# Patient Record
Sex: Female | Born: 1975 | Race: White | Hispanic: No | Marital: Married | Smoking: Never smoker
Health system: Southern US, Community
[De-identification: ages and names within clinical notes are randomized; demographics above are authoritative.]

## PROBLEM LIST (undated history)

## (undated) ENCOUNTER — Inpatient Hospital Stay (HOSPITAL_COMMUNITY): Payer: Self-pay

## (undated) DIAGNOSIS — Z8672 Personal history of thrombophlebitis: Secondary | ICD-10-CM

## (undated) DIAGNOSIS — O24419 Gestational diabetes mellitus in pregnancy, unspecified control: Secondary | ICD-10-CM

## (undated) HISTORY — DX: Personal history of thrombophlebitis: Z86.72

## (undated) HISTORY — DX: Gestational diabetes mellitus in pregnancy, unspecified control: O24.419

## (undated) HISTORY — PX: NO PAST SURGERIES: SHX2092

---

## 2011-09-05 ENCOUNTER — Encounter (HOSPITAL_COMMUNITY): Payer: Self-pay | Admitting: *Deleted

## 2011-09-05 ENCOUNTER — Inpatient Hospital Stay (HOSPITAL_COMMUNITY)
Admission: EM | Admit: 2011-09-05 | Discharge: 2011-09-08 | DRG: 078 | Disposition: A | Discharge status: Home / Self Care | Payer: BC Managed Care – PPO | Attending: Internal Medicine | Admitting: Internal Medicine

## 2011-09-05 ENCOUNTER — Other Ambulatory Visit: Payer: Self-pay

## 2011-09-05 ENCOUNTER — Emergency Department (HOSPITAL_COMMUNITY): Payer: BC Managed Care – PPO

## 2011-09-05 DIAGNOSIS — Z79899 Other long term (current) drug therapy: Secondary | ICD-10-CM

## 2011-09-05 DIAGNOSIS — I2699 Other pulmonary embolism without acute cor pulmonale: Principal | ICD-10-CM | POA: Diagnosis present

## 2011-09-05 DIAGNOSIS — E663 Overweight: Secondary | ICD-10-CM | POA: Diagnosis present

## 2011-09-05 DIAGNOSIS — Z3041 Encounter for surveillance of contraceptive pills: Secondary | ICD-10-CM

## 2011-09-05 LAB — COMPREHENSIVE METABOLIC PANEL
ALT: 13 U/L (ref 0–35)
AST: 11 U/L (ref 0–37)
Alkaline Phosphatase: 75 U/L (ref 39–117)
CO2: 19 mEq/L (ref 19–32)
Calcium: 9.9 mg/dL (ref 8.4–10.5)
GFR calc Af Amer: >90 mL/min (ref >90)
Glucose, Bld: 115 mg/dL — ABNORMAL HIGH (ref 70–99)
Potassium: 4 mEq/L (ref 3.5–5.1)
Sodium: 135 mEq/L (ref 135–145)
Total Protein: 7.5 g/dL (ref 6.0–8.3)

## 2011-09-05 LAB — CBC
Platelets: 252 10*3/uL (ref 150–400)
RBC: 4.65 MIL/uL (ref 3.87–5.11)
RDW: 12.4 % (ref 11.5–15.5)
WBC: 13.7 10*3/uL — ABNORMAL HIGH (ref 4.0–10.5)

## 2011-09-05 LAB — POCT PREGNANCY, URINE: Preg Test, Ur: NEGATIVE

## 2011-09-05 LAB — URINALYSIS, ROUTINE W REFLEX MICROSCOPIC
Leukocytes, UA: NEGATIVE
Nitrite: NEGATIVE
Protein, ur: NEGATIVE mg/dL
Specific Gravity, Urine: 1.017 (ref 1.005–1.030)
Urobilinogen, UA: 0.2 mg/dL (ref 0.0–1.0)

## 2011-09-05 LAB — POCT I-STAT TROPONIN I

## 2011-09-05 LAB — DIFFERENTIAL
Basophils Absolute: 0 10*3/uL (ref 0.0–0.1)
Eosinophils Relative: 2 % (ref 0–5)
Lymphocytes Relative: 15 % (ref 12–46)
Lymphs Abs: 2.1 10*3/uL (ref 0.7–4.0)
Neutrophils Relative %: 76 % (ref 43–77)

## 2011-09-05 LAB — PRO B NATRIURETIC PEPTIDE: Pro B Natriuretic peptide (BNP): 22 pg/mL (ref 0–125)

## 2011-09-05 LAB — URINE MICROSCOPIC-ADD ON

## 2011-09-05 LAB — D-DIMER, QUANTITATIVE: D-Dimer, Quant: 2.28 ug/mL-FEU — ABNORMAL HIGH (ref 0.00–0.48)

## 2011-09-05 MED ORDER — NITROGLYCERIN 0.4 MG SL SUBL: 0.4000 mg | SUBLINGUAL_TABLET | SUBLINGUAL | Status: DC | PRN

## 2011-09-05 MED ORDER — ASPIRIN 325 MG PO TABS: 325.0000 mg | ORAL_TABLET | ORAL | Status: DC

## 2011-09-05 NOTE — ED Notes (Signed)
Patient transported to X-ray 

## 2011-09-05 NOTE — ED Notes (Signed)
The pt is c/o some posterior chest pain whenever she breathes otherwise she feels ok

## 2011-09-05 NOTE — ED Notes (Signed)
Pt was having back pain yesterday and CP under her left breast which began this am and became worse this pm.  Pt has sob with this.  No n/v or diaphoresis

## 2011-09-05 NOTE — ED Provider Notes (Signed)
History     CSN: [id number]  Arrival date & time 09/05/11  2127   First MD Initiated Contact with Patient 09/05/11 2213      Chief Complaint  Patient presents with  . Chest Pain    (Consider location/radiation/quality/duration/timing/severity/associated sxs/prior treatment) Patient is a 36 y.o. female presenting with chest pain. The history is provided by the patient. No language interpreter was used.  Chest Pain The chest pain began yesterday. Chest pain occurs constantly. The chest pain is worsening. The pain is associated with breathing. At its most intense, the pain is at 10/10. The pain is currently at 2/10. The severity of the pain is moderate. The quality of the pain is described as aching, sharp and stabbing.    patient here today complaining of left side pain is as bad as 10 out of 10 with shortness of breath. States that she woke up yesterday morning with upper back pain that subsided after the day went on. States she woke up this morning with the left side pain that did radiate at one point but now it is located under the left ribs. The pain is not reproducible but it does cause shortness of breath intermittently. States she has no history of a blood clot and she has no history of long trips she has no calf pain. She  has been on long trips, she is on the nova ring.  No family history of blood clots just  hypertension.  History reviewed. No pertinent past medical history.  History reviewed. No pertinent past surgical history.  No family history on file.  History  Substance Use Topics  . Smoking status: Not on file  . Smokeless tobacco: Not on file  . Alcohol Use: No    OB History    Grav Para Term Preterm Abortions TAB SAB Ect Mult Living                  Review of Systems  Cardiovascular: Positive for chest pain.    Allergies  Review of patient's allergies indicates no known allergies.  Home Medications   Current Outpatient Rx  Name Route Sig Dispense  Refill  . ETONOGESTREL-ETHINYL ESTRADIOL 0.12-0.015 MG/24HR VA RING Vaginal Place 1 each vaginally every 28 (twenty-eight) days. Insert vaginally and leave in place for 3 consecutive weeks, then remove for 1 week.    . ALEVE-D SINUS & COLD PO Oral Take 1 tablet by mouth daily as needed. For cold and sinus      BP 130/90  Pulse 111  Temp(Src) 99.4 F (37.4 C) (Oral)  Resp 20  SpO2 97%  LMP 08/22/2011  Physical Exam  Nursing note and vitals reviewed. Constitutional: She is oriented to person, place, and time. She appears well-developed and well-nourished.  HENT:  Head: Normocephalic and atraumatic.  Eyes: Conjunctivae and EOM are normal. Pupils are equal, round, and reactive to light.  Neck: Normal range of motion. Neck supple.  Cardiovascular: Normal rate, regular rhythm, normal heart sounds and intact distal pulses.  Exam reveals no gallop and no friction rub.   No murmur heard. Pulmonary/Chest: Effort normal and breath sounds normal. No respiratory distress. She has no wheezes. She has no rales. She exhibits no tenderness.       Pain located under L ribs laterally  Abdominal: Soft. Bowel sounds are normal. She exhibits no distension. There is no tenderness.  Musculoskeletal: Normal range of motion. She exhibits no edema and no tenderness.  Neurological: She is alert and oriented to  person, place, and time. She has normal reflexes. No cranial nerve deficit.  Skin: Skin is warm and dry.  Psychiatric: She has a normal mood and affect.    ED Course  Procedures (including critical care time)   Labs Reviewed  CBC  BASIC METABOLIC PANEL  PRO B NATRIURETIC PEPTIDE  TROPONIN I   Dg Chest 2 View  09/05/2011  *RADIOLOGY REPORT*  Clinical Data: 36 year old female with chest pain, cough, pleuritic pain.  CHEST - 2 VIEW  Comparison: None.  Findings: Low lung volumes with bibasilar atelectasis.  No definite pleural effusion.  Cardiac size and mediastinal contours are within normal limits.   Visualized tracheal air column is within normal limits.  No pneumothorax, pulmonary edema or consolidation. Negative visualized bowel gas pattern.  Exaggerated lower thoracic kyphosis.  IMPRESSION: Low lung volumes with bibasilar atelectasis.  Original Report Authenticated By: Harley Hallmark, M.D.     No diagnosis found.    MDM   36 year old coming in with pain under her left lateral rib cage. She is tachycardic and has shortness of breath intermittently with the pain. She woke yesterday with upper back pain that subsided throughout the day. Woke this morning with left lateral pain under her rib cage. States that the pain did make her short of breath intermittently.   CT of the chest  shows numerous bilateral pulmonary emboli bilateral lower lobes. Heparin bolus and drip started. Patient will be admitted via hospitalist. Grandmother had a blood clot per Dr. Hyacinth Meeker. D dimer 2.28.  She will be admitted per hospitalist.  Heparing bolus and drip started in the ER.     Labs Reviewed  D-DIMER, QUANTITATIVE - Abnormal; Notable for the following:    D-Dimer, Quant 2.28 (*)    All other components within normal limits  URINALYSIS, ROUTINE W REFLEX MICROSCOPIC - Abnormal; Notable for the following:    Hgb urine dipstick TRACE (*)    All other components within normal limits  CBC - Abnormal; Notable for the following:    WBC 13.7 (*)    All other components within normal limits  DIFFERENTIAL - Abnormal; Notable for the following:    Neutro Abs 10.3 (*)    All other components within normal limits  COMPREHENSIVE METABOLIC PANEL - Abnormal; Notable for the following:    Glucose, Bld 115 (*)    Albumin 3.4 (*)    Total Bilirubin 0.2 (*)    All other components within normal limits  PRO B NATRIURETIC PEPTIDE  POCT PREGNANCY, URINE  POCT I-STAT TROPONIN I  URINE MICROSCOPIC-ADD ON  PROTIME-INR  APTT         Jethro Bastos, NP 09/06/11 1246

## 2011-09-06 ENCOUNTER — Encounter (HOSPITAL_COMMUNITY): Payer: Self-pay | Admitting: Radiology

## 2011-09-06 ENCOUNTER — Emergency Department (HOSPITAL_COMMUNITY): Payer: BC Managed Care – PPO

## 2011-09-06 DIAGNOSIS — I2699 Other pulmonary embolism without acute cor pulmonale: Secondary | ICD-10-CM | POA: Diagnosis present

## 2011-09-06 DIAGNOSIS — Z3041 Encounter for surveillance of contraceptive pills: Secondary | ICD-10-CM

## 2011-09-06 LAB — BASIC METABOLIC PANEL
GFR calc Af Amer: >90 mL/min (ref >90)
GFR calc non Af Amer: >90 mL/min (ref >90)
Glucose, Bld: 113 mg/dL — ABNORMAL HIGH (ref 70–99)
Potassium: 4.3 mEq/L (ref 3.5–5.1)
Sodium: 140 mEq/L (ref 135–145)

## 2011-09-06 LAB — HOMOCYSTEINE: Homocysteine: 7 umol/L (ref 4.0–15.4)

## 2011-09-06 LAB — CBC
HCT: 40 % (ref 36.0–46.0)
RDW: 12.6 % (ref 11.5–15.5)
WBC: 11.3 10*3/uL — ABNORMAL HIGH (ref 4.0–10.5)

## 2011-09-06 LAB — APTT: aPTT: 27 seconds (ref 24–37)

## 2011-09-06 LAB — PROTIME-INR: Prothrombin Time: 14.6 seconds (ref 11.6–15.2)

## 2011-09-06 LAB — HEPARIN LEVEL (UNFRACTIONATED): Heparin Unfractionated: 0.41 IU/mL (ref 0.30–0.70)

## 2011-09-06 LAB — MRSA PCR SCREENING: MRSA by PCR: NEGATIVE

## 2011-09-06 MED ORDER — PATIENT'S GUIDE TO USING COUMADIN BOOK
Freq: Once | Status: DC
  Filled 2011-09-06 (×2): qty 1

## 2011-09-06 MED ORDER — OXYCODONE HCL 5 MG PO TABS
5.0000 mg | ORAL_TABLET | ORAL | Status: DC | PRN
  Administered 2011-09-06 (×2): 5 mg via ORAL
  Filled 2011-09-06 (×3): qty 1

## 2011-09-06 MED ORDER — RIVAROXABAN 15 MG PO TABS
15.0000 mg | ORAL_TABLET | Freq: Two times a day (BID) | ORAL | Status: DC
  Filled 2011-09-06 (×2): qty 1

## 2011-09-06 MED ORDER — HEPARIN BOLUS VIA INFUSION
5000.0000 [IU] | Freq: Once | INTRAVENOUS | Status: AC
  Administered 2011-09-06: 5000 [IU] via INTRAVENOUS

## 2011-09-06 MED ORDER — HEPARIN (PORCINE) IN NACL 100-0.45 UNIT/ML-% IJ SOLN
1250.0000 [IU]/h | INTRAMUSCULAR | Status: AC
  Administered 2011-09-06: 1250 [IU]/h via INTRAVENOUS
  Filled 2011-09-06: qty 250

## 2011-09-06 MED ORDER — SODIUM CHLORIDE 0.9 % IV SOLN: INTRAVENOUS | Status: DC

## 2011-09-06 MED ORDER — HEPARIN (PORCINE) IN NACL 100-0.45 UNIT/ML-% IJ SOLN
16.0000 [IU]/kg/h | INTRAMUSCULAR | Status: DC
  Administered 2011-09-06 (×2): 16 [IU]/kg/h via INTRAVENOUS
  Filled 2011-09-06 (×3): qty 250

## 2011-09-06 MED ORDER — WARFARIN SODIUM 7.5 MG PO TABS
7.5000 mg | ORAL_TABLET | Freq: Once | ORAL | Status: DC
  Filled 2011-09-06: qty 1

## 2011-09-06 MED ORDER — IOHEXOL 350 MG/ML SOLN
100.0000 mL | Freq: Once | INTRAVENOUS | Status: AC | PRN
  Administered 2011-09-06: 100 mL via INTRAVENOUS

## 2011-09-06 MED ORDER — RIVAROXABAN 10 MG PO TABS: 20.0000 mg | ORAL_TABLET | Freq: Every day | ORAL | Status: DC

## 2011-09-06 MED ORDER — MORPHINE SULFATE 2 MG/ML IJ SOLN
2.0000 mg | INTRAMUSCULAR | Status: DC | PRN
  Administered 2011-09-06 (×2): 2 mg via INTRAVENOUS
  Filled 2011-09-06 (×2): qty 1

## 2011-09-06 MED ORDER — RIVAROXABAN 15 MG PO TABS
15.0000 mg | ORAL_TABLET | Freq: Two times a day (BID) | ORAL | Status: DC
  Administered 2011-09-07 – 2011-09-08 (×3): 15 mg via ORAL
  Filled 2011-09-06 (×5): qty 1

## 2011-09-06 MED ORDER — WARFARIN VIDEO
Freq: Once | Status: DC
Start: 2011-09-06 — End: 2011-09-06

## 2011-09-06 MED ORDER — SODIUM CHLORIDE 0.9 % IJ SOLN
3.0000 mL | Freq: Two times a day (BID) | INTRAMUSCULAR | Status: DC
  Administered 2011-09-07 – 2011-09-08 (×3): 3 mL via INTRAVENOUS

## 2011-09-06 NOTE — ED Notes (Signed)
Report to the floor has been called.  Waiting for orders  To transport the pt upstairs

## 2011-09-06 NOTE — ED Notes (Signed)
Heparin drip started at 12.107ml/hr and or 1250units

## 2011-09-06 NOTE — Plan of Care (Signed)
Problem: Phase I Progression Outcomes Goal: Flu/PneumoVaccines if indicated Outcome: Progressing Patient doesn't know if she wants a flu shot-wishes time to think about getting vaccination

## 2011-09-06 NOTE — ED Notes (Signed)
The pt thinks her pain is better

## 2011-09-06 NOTE — Progress Notes (Signed)
  Echocardiogram 2D Echocardiogram has been performed.  Catherine Garcia, Real Cons 09/06/2011, 10:50 AM

## 2011-09-06 NOTE — Progress Notes (Signed)
ANTICOAGULATION CONSULT NOTE - Initial Consult  Pharmacy Consult for Heparin and Coumadin Indication: pulmonary embolus  No Known Allergies  Patient Measurements: Height: 5\' 4"  (162.6 cm) Weight: 174 lb 2.6 oz (79 kg) IBW/kg (Calculated) : 54.7   Vital Signs: Temp: 99.2 F (37.3 C) (02/24 0016) Temp src: Oral (02/24 0016) BP: 123/70 mmHg (02/24 0016) Pulse Rate: 102  (02/24 0016)  Labs:  Catherine Garcia 09/05/11 2215 09/05/11 0024  HGB 14.2 --  HCT 41.0 --  PLT 252 --  APTT -- 27  LABPROT -- 14.6  INR -- 1.12  HEPARINUNFRC -- --  CREATININE 0.71 --  CKTOTAL -- --  CKMB -- --  TROPONINI -- --   Estimated Creatinine Clearance: 101.7 ml/min (by C-G formula based on Cr of 0.71).  Medical History: History reviewed. No pertinent past medical history.  Medications:  Nuvaring  Assessment: 36 yo female with new PE for anticoagulation.  Heparin 5000 units IV bolus, 1250 units/hr started in ED at 1:15 am.  Goal of Therapy:  INR 2-3 Heparin level 0.3-0.7 units/ml   Plan:  Continue Heparin at current rate. Follow-up am labs. Coumadin 7.5 mg tonight.    Given age, renal function, and social history, consider using rivaroxaban (Xarelto) for treatment of PE.  Catherine Garcia 09/06/2011,2:24 AM

## 2011-09-06 NOTE — ED Notes (Signed)
36 year old female is on oral contraceptive therapy who presents with left-sided sharp and shooting chest pain is pleuritic, worse with taking deep breathing and persistent over the last 24 hours. She denies fevers, swelling in the legs, trauma, surgery, immobilization or travel. There is no family history or personal history of venous thromboembolism.  Physical exam:  Abdomen soft nontender, lungs are clear, no wheezing rales or rhonchi, no respiratory distress or increased work of breathing. Heart is regular at 100 beats per minute, no murmurs rubs or gallops, place with minimal asymmetry left calf greater than right calf but no edema. Pulses are normal, skin is without rash, mental status is  Assessment:  Possible pulmonary embolism, patient meant criteria to test with d-dimer, - d-dimer is elevated, CT angiogram ordered.  Medical screening examination/treatment/procedure(s) were conducted as a shared visit with non-physician practitioner(s) and myself.  I personally evaluated the patient during the encounter   ED ECG REPORT   Date: 09/06/2011   Rate: 112  Rhythm: sinus tachycardia  QRS Axis: normal  Intervals: normal  ST/T Wave abnormalities: nonspecific T wave changes  Conduction Disutrbances:none  Narrative Interpretation:   Old EKG Reviewed: none available   Patient reevaluated several times, has persistent mild tachycardia, CT scan reviewed by myself and agree with radiologist read that there is bilateral pulmonary emboli.  The PE's are large but not causing any R heart strain on ECG and not hypotensive so will withold TpA AT THIS time.  Because of this and started the patient on heparin bolus and infusion for immediate anticoagulation. Critical care delivered  CRITICAL CARE Performed by: Vida Roller   Total critical care time: 35  Critical care time was exclusive of separately billable procedures and treating other patients.  Critical care was necessary to treat or prevent  imminent or life-threatening deterioration.  Critical care was time spent personally by me on the following activities: development of treatment plan with patient and/or surrogate as well as nursing, discussions with consultants, evaluation of patient's response to treatment, examination of patient, obtaining history from patient or surrogate, ordering and performing treatments and interventions, ordering and review of laboratory studies, ordering and review of radiographic studies, pulse oximetry and re-evaluation of patient's condition.   Results for orders placed during the hospital encounter of 09/05/11  PRO B NATRIURETIC PEPTIDE      Component Value Range   Pro B Natriuretic peptide (BNP) 22.0  0 - 125 (pg/mL)  D-DIMER, QUANTITATIVE      Component Value Range   D-Dimer, Quant 2.28 (*) 0.00 - 0.48 (ug/mL-FEU)  URINALYSIS, ROUTINE W REFLEX MICROSCOPIC      Component Value Range   Color, Urine YELLOW  YELLOW    APPearance CLEAR  CLEAR    Specific Gravity, Urine 1.017  1.005 - 1.030    pH 6.0  5.0 - 8.0    Glucose, UA NEGATIVE  NEGATIVE (mg/dL)   Hgb urine dipstick TRACE (*) NEGATIVE    Bilirubin Urine NEGATIVE  NEGATIVE    Ketones, ur NEGATIVE  NEGATIVE (mg/dL)   Protein, ur NEGATIVE  NEGATIVE (mg/dL)   Urobilinogen, UA 0.2  0.0 - 1.0 (mg/dL)   Nitrite NEGATIVE  NEGATIVE    Leukocytes, UA NEGATIVE  NEGATIVE   CBC      Component Value Range   WBC 13.7 (*) 4.0 - 10.5 (K/uL)   RBC 4.65  3.87 - 5.11 (MIL/uL)   Hemoglobin 14.2  12.0 - 15.0 (g/dL)   HCT 14.7  82.9 - 56.2 (%)  MCV 88.2  78.0 - 100.0 (fL)   MCH 30.5  26.0 - 34.0 (pg)   MCHC 34.6  30.0 - 36.0 (g/dL)   RDW 16.1  09.6 - 04.5 (%)   Platelets 252  150 - 400 (K/uL)  DIFFERENTIAL      Component Value Range   Neutrophils Relative 76  43 - 77 (%)   Neutro Abs 10.3 (*) 1.7 - 7.7 (K/uL)   Lymphocytes Relative 15  12 - 46 (%)   Lymphs Abs 2.1  0.7 - 4.0 (K/uL)   Monocytes Relative 7  3 - 12 (%)   Monocytes Absolute 1.0   0.1 - 1.0 (K/uL)   Eosinophils Relative 2  0 - 5 (%)   Eosinophils Absolute 0.2  0.0 - 0.7 (K/uL)   Basophils Relative 0  0 - 1 (%)   Basophils Absolute 0.0  0.0 - 0.1 (K/uL)  COMPREHENSIVE METABOLIC PANEL      Component Value Range   Sodium 135  135 - 145 (mEq/L)   Potassium 4.0  3.5 - 5.1 (mEq/L)   Chloride 102  96 - 112 (mEq/L)   CO2 19  19 - 32 (mEq/L)   Glucose, Bld 115 (*) 70 - 99 (mg/dL)   BUN 17  6 - 23 (mg/dL)   Creatinine, Ser 4.09  0.50 - 1.10 (mg/dL)   Calcium 9.9  8.4 - 81.1 (mg/dL)   Total Protein 7.5  6.0 - 8.3 (g/dL)   Albumin 3.4 (*) 3.5 - 5.2 (g/dL)   AST 11  0 - 37 (U/L)   ALT 13  0 - 35 (U/L)   Alkaline Phosphatase 75  39 - 117 (U/L)   Total Bilirubin 0.2 (*) 0.3 - 1.2 (mg/dL)   GFR calc non Af Amer >90  >90 (mL/min)   GFR calc Af Amer >90  >90 (mL/min)  POCT PREGNANCY, URINE      Component Value Range   Preg Test, Ur NEGATIVE  NEGATIVE   POCT I-STAT TROPONIN I      Component Value Range   Troponin i, poc 0.00  0.00 - 0.08 (ng/mL)   Comment 3           URINE MICROSCOPIC-ADD ON      Component Value Range   Squamous Epithelial / LPF RARE  RARE    WBC, UA 0-2  <3 (WBC/hpf)   RBC / HPF 0-2  <3 (RBC/hpf)   Bacteria, UA RARE  RARE    Dg Chest 2 View  09/05/2011  *RADIOLOGY REPORT*  Clinical Data: 36 year old female with chest pain, cough, pleuritic pain.  CHEST - 2 VIEW  Comparison: None.  Findings: Low lung volumes with bibasilar atelectasis.  No definite pleural effusion.  Cardiac size and mediastinal contours are within normal limits.  Visualized tracheal air column is within normal limits.  No pneumothorax, pulmonary edema or consolidation. Negative visualized bowel gas pattern.  Exaggerated lower thoracic kyphosis.  IMPRESSION: Low lung volumes with bibasilar atelectasis.  Original Report Authenticated By: Harley Hallmark, M.D.   Ct Angio Chest W/cm &/or Wo Cm  09/06/2011  *RADIOLOGY REPORT*  Clinical Data: Shortness of breath.  CT ANGIOGRAPHY CHEST   Technique:  Multidetector CT imaging of the chest using the standard protocol during bolus administration of intravenous contrast. Multiplanar reconstructed images including MIPs were obtained and reviewed to evaluate the vascular anatomy.  Contrast:   100 ml Omnipaque 300 IV.  Comparison:  09/05/2011  Findings:  Pulmonary emboli are seen bilaterally involving all  lobes of both lungs, most pronounced in the lower lobes bilaterally.  Associated trace left pleural effusion.  Left basilar opacity, most likely atelectasis.  No confluent opacity on the right.  Heart is normal size. No mediastinal, hilar, or axillary adenopathy.  Visualized thyroid and chest wall soft tissues unremarkable. Imaging into the upper abdomen shows no acute findings.  IMPRESSION: Numerous bilateral pulmonary emboli, most pronounced in both lower lobes.  Original Report Authenticated By: Cyndie Chime, M.D.      Vida Roller, MD 09/06/11 [phone]

## 2011-09-06 NOTE — Progress Notes (Addendum)
[facility] [place] TEAM 8  Subjective: 36 y.o. female with benign past medical history on NuvaRing, presents to [facility] emergency room complaining of acute shortness of breath and intermittent chest pain since yesterday.  Evaluation in emergency room included a CT scan angiogram which showed bilateral multiple pulmonary emboli especially in the lower lobes.   She is resting comfortably at the time of my evaluation.  She reports that the NuvaRing is still in place.  She denies sob at rest.   Objective: Weight change:   Intake/Output Summary (Last 24 hours) at 09/06/11 1431 Last data filed at 09/06/11 1300  Gross per 24 hour  Intake 405.38 ml  Output   2200 ml  Net -1794.62 ml   Blood pressure 112/83, pulse 100, temperature 98.4 F (36.9 C), temperature source Oral, resp. rate 26, height 5\' 4"  (1.626 m), weight 82.6 kg (182 lb 1.6 oz), last menstrual period 08/22/2011, SpO2 96.00%.  Physical Exam: F/u exam is completed  Lab Results:  Basename 09/06/11 0830 09/05/11 2215  NA 140 135  K 4.3 4.0  CL 107 102  CO2 21 19  GLUCOSE 113* 115*  BUN 10 17  CREATININE 0.70 0.71  CALCIUM 9.5 9.9  MG -- --  PHOS -- --    Basename 09/05/11 2215  AST 11  ALT 13  ALKPHOS 75  BILITOT 0.2*  PROT 7.5  ALBUMIN 3.4*    Basename 09/06/11 0830 09/05/11 2215  WBC 11.3* 13.7*  NEUTROABS -- 10.3*  HGB 14.0 14.2  HCT 40.0 41.0  MCV 87.9 88.2  PLT 234 252    Micro Results: Recent Results (from the past 240 hour(s))  MRSA PCR SCREENING     Status: Normal   Collection Time   09/06/11  3:50 AM      Component Value Range Status Comment   MRSA by PCR NEGATIVE  NEGATIVE  Final     Studies/Results: All recent x-ray/radiology reports have been reviewed in detail.   Medications: I have reviewed the patient's complete medication list.  Assessment/Plan:  B numerous pulmonary emboli Precipitated by use of hormonal birth control +/- genetic predisposition +/- being  overweight - counseled on need for wgt loss and against further use of hormonal based birth control - for now, will plan to begin Xarelto in the AM - will need to confirm that she can afford this med/that it is covered by her insurance carrier - if not, will need to swap to coumadin + lovenox and confirm that lovenox will be covered by her insurance/be affordable - if not, will have to remain in hospital for Iv heparin + coumadin - will either need 6 months of anticoag (if genetic testing negative), or life long (if positive)  Possible genetic hypercoagulable state hyercoag w/u pending (grandmother and mother both have hx of DVTs)  Overweight Advised on weight loss  Addendum When attempting to ambulate to the bathroom, w/ RN assist, the pt became very dyspneic and extremely weak - she almost fell, but her RN supported her to prevent this - as a result, I will cancel her transfer and continue to monitor her in a SDU setting  Lonia Blood, MD [facility] Office  [phone] Pager [phone]  On-Call/Text Page:      [email or web address]      password [facility]

## 2011-09-06 NOTE — ED Notes (Signed)
Heparin bolus 5000 units infused

## 2011-09-06 NOTE — ED Notes (Signed)
Pt placed on the monitor.  Sinus tachy 112

## 2011-09-06 NOTE — H&P (Signed)
PCP: None   Chief Complaint: Acute shortness of breath and chest pain.   HPI: Catherine Garcia is an 36 y.o. female with benign past medical history on NuvaRing, presents to [facility] emergency room complaining of acute shortness of breath and intermittent chest pain since yesterday. Yesterday was mild, today is much more severe. Aside from her birth control ring, she has no other known precipitating factor for PE. Evaluation in emergency room included a CT scan angiogram which showed bilateral multiple pulmonary emboli especially in the lower lobes. She was found to have tachycardia with a heart rate of 112. Her EKG shows sinus tachycardia without any acute ST-T changes. She has normal platelet count and normal renal function tests. Her pregnancy test is negative. Hospitalist was asked to admit her for acute bilateral multiple pulmonary emboli.  Rewiew of Systems:  The patient denies anorexia, fever, weight loss,, vision loss, decreased hearing, hoarseness,  syncope, , peripheral edema, balance deficits, hemoptysis, abdominal pain, melena, hematochezia, severe indigestion/heartburn, hematuria, incontinence, genital sores, muscle weakness, suspicious skin lesions, transient blindness, difficulty walking, depression, unusual weight change, abnormal bleeding, enlarged lymph nodes, angioedema, and breast masses.    History reviewed. No pertinent past medical history.  History reviewed. No pertinent past surgical history.  Medications:  HOME MEDS: Prior to Admission medications   Medication Sig Start Date End Date Taking? Authorizing Provider  etonogestrel-ethinyl estradiol (NUVARING) 0.12-0.015 MG/24HR vaginal ring Place 1 each vaginally every 28 (twenty-eight) days. Insert vaginally and leave in place for 3 consecutive weeks, then remove for 1 week.   Yes Historical Provider, MD  Pseudoephedrine-Naproxen Na (ALEVE-D SINUS & COLD PO) Take 1 tablet by mouth daily as needed. For cold and sinus    Yes Historical Provider, MD     Allergies:  No Known Allergies  Social History:   does not have a smoking history on file. She does not have any smokeless tobacco history on file. She reports that she does not drink alcohol. Her drug history not on file. she is a Chiropodist working in Emergency planning/management officer  Family History: No family history of, but disease. She has 6 siblings. Physical Exam: Filed Vitals:   09/05/11 2136 09/06/11 0016  BP: 130/90 123/70  Pulse: 111 102  Temp: 99.4 F (37.4 C) 99.2 F (37.3 C)  TempSrc: Oral Oral  Resp: 20 18  Height:  5\' 4"  (1.626 m)  Weight:  79 kg (174 lb 2.6 oz)  SpO2: 97% 100%   Blood pressure 123/70, pulse 102, temperature 99.2 F (37.3 C), temperature source Oral, resp. rate 18, height 5\' 4"  (1.626 m), weight 79 kg (174 lb 2.6 oz), last menstrual period 08/22/2011, SpO2 100.00%.  GEN:  Pleasant  person lying in the stretcher in no acute distress; cooperative with exam PSYCH:  alert and oriented x4; does not appear anxious does not appear depressed; affect is normal HEENT: Mucous membranes pink and anicteric; PERRLA; EOM intact; no cervical lymphadenopathy nor thyromegaly or carotid bruit; no JVD; Breasts:: Not examined CHEST WALL: No tenderness CHEST: Normal respiration, clear to auscultation bilaterally HEART: Regular rate and rhythm; no murmurs rubs or gallops BACK: No kyphosis or scoliosis; no CVA tenderness ABDOMEN: Obese, soft non-tender; no masses, no organomegaly, normal abdominal bowel sounds; no pannus; no intertriginous candida. Rectal Exam: Not done EXTREMITIES: No bone or joint deformity; age-appropriate arthropathy of the hands and knees; no edema; no ulcerations. Genitalia: not examined PULSES: 2+ and symmetric SKIN: Normal hydration no rash or ulceration CNS: Cranial nerves 2-12  grossly intact no focal neurologic deficit   Labs & Imaging Results for orders placed during the hospital encounter of 09/05/11 (from the past 48  hour(s))  PROTIME-INR     Status: Normal   Collection Time   09/05/11 12:24 AM      Component Value Range Comment   Prothrombin Time 14.6  11.6 - 15.2 (seconds)    INR 1.12  0.00 - 1.49    APTT     Status: Normal   Collection Time   09/05/11 12:24 AM      Component Value Range Comment   aPTT 27  24 - 37 (seconds)   PRO B NATRIURETIC PEPTIDE     Status: Normal   Collection Time   09/05/11 10:07 PM      Component Value Range Comment   Pro B Natriuretic peptide (BNP) 22.0  0 - 125 (pg/mL)   D-DIMER, QUANTITATIVE     Status: Abnormal   Collection Time   09/05/11 10:15 PM      Component Value Range Comment   D-Dimer, Quant 2.28 (*) 0.00 - 0.48 (ug/mL-FEU)   CBC     Status: Abnormal   Collection Time   09/05/11 10:15 PM      Component Value Range Comment   WBC 13.7 (*) 4.0 - 10.5 (K/uL)    RBC 4.65  3.87 - 5.11 (MIL/uL)    Hemoglobin 14.2  12.0 - 15.0 (g/dL)    HCT 16.1  09.6 - 04.5 (%)    MCV 88.2  78.0 - 100.0 (fL)    MCH 30.5  26.0 - 34.0 (pg)    MCHC 34.6  30.0 - 36.0 (g/dL)    RDW 40.9  81.1 - 91.4 (%)    Platelets 252  150 - 400 (K/uL)   DIFFERENTIAL     Status: Abnormal   Collection Time   09/05/11 10:15 PM      Component Value Range Comment   Neutrophils Relative 76  43 - 77 (%)    Neutro Abs 10.3 (*) 1.7 - 7.7 (K/uL)    Lymphocytes Relative 15  12 - 46 (%)    Lymphs Abs 2.1  0.7 - 4.0 (K/uL)    Monocytes Relative 7  3 - 12 (%)    Monocytes Absolute 1.0  0.1 - 1.0 (K/uL)    Eosinophils Relative 2  0 - 5 (%)    Eosinophils Absolute 0.2  0.0 - 0.7 (K/uL)    Basophils Relative 0  0 - 1 (%)    Basophils Absolute 0.0  0.0 - 0.1 (K/uL)   COMPREHENSIVE METABOLIC PANEL     Status: Abnormal   Collection Time   09/05/11 10:15 PM      Component Value Range Comment   Sodium 135  135 - 145 (mEq/L)    Potassium 4.0  3.5 - 5.1 (mEq/L)    Chloride 102  96 - 112 (mEq/L)    CO2 19  19 - 32 (mEq/L)    Glucose, Bld 115 (*) 70 - 99 (mg/dL)    BUN 17  6 - 23 (mg/dL)    Creatinine, Ser  7.82  0.50 - 1.10 (mg/dL)    Calcium 9.9  8.4 - 10.5 (mg/dL)    Total Protein 7.5  6.0 - 8.3 (g/dL)    Albumin 3.4 (*) 3.5 - 5.2 (g/dL)    AST 11  0 - 37 (U/L)    ALT 13  0 - 35 (U/L)    Alkaline Phosphatase 75  39 - 117 (U/L)    Total Bilirubin 0.2 (*) 0.3 - 1.2 (mg/dL)    GFR calc non Af Amer >90  >90 (mL/min)    GFR calc Af Amer >90  >90 (mL/min)   URINALYSIS, ROUTINE W REFLEX MICROSCOPIC     Status: Abnormal   Collection Time   09/05/11 10:52 PM      Component Value Range Comment   Color, Urine YELLOW  YELLOW     APPearance CLEAR  CLEAR     Specific Gravity, Urine 1.017  1.005 - 1.030     pH 6.0  5.0 - 8.0     Glucose, UA NEGATIVE  NEGATIVE (mg/dL)    Hgb urine dipstick TRACE (*) NEGATIVE     Bilirubin Urine NEGATIVE  NEGATIVE     Ketones, ur NEGATIVE  NEGATIVE (mg/dL)    Protein, ur NEGATIVE  NEGATIVE (mg/dL)    Urobilinogen, UA 0.2  0.0 - 1.0 (mg/dL)    Nitrite NEGATIVE  NEGATIVE     Leukocytes, UA NEGATIVE  NEGATIVE    URINE MICROSCOPIC-ADD ON     Status: Normal   Collection Time   09/05/11 10:52 PM      Component Value Range Comment   Squamous Epithelial / LPF RARE  RARE     WBC, UA 0-2  <3 (WBC/hpf)    RBC / HPF 0-2  <3 (RBC/hpf)    Bacteria, UA RARE  RARE    POCT PREGNANCY, URINE     Status: Normal   Collection Time   09/05/11 11:00 PM      Component Value Range Comment   Preg Test, Ur NEGATIVE  NEGATIVE    POCT I-STAT TROPONIN I     Status: Normal   Collection Time   09/05/11 11:10 PM      Component Value Range Comment   Troponin i, poc 0.00  0.00 - 0.08 (ng/mL)    Comment 3             Dg Chest 2 View  09/05/2011  *RADIOLOGY REPORT*  Clinical Data: 36 year old female with chest pain, cough, pleuritic pain.  CHEST - 2 VIEW  Comparison: None.  Findings: Low lung volumes with bibasilar atelectasis.  No definite pleural effusion.  Cardiac size and mediastinal contours are within normal limits.  Visualized tracheal air column is within normal limits.  No  pneumothorax, pulmonary edema or consolidation. Negative visualized bowel gas pattern.  Exaggerated lower thoracic kyphosis.  IMPRESSION: Low lung volumes with bibasilar atelectasis.  Original Report Authenticated By: Harley Hallmark, M.D.   Ct Angio Chest W/cm &/or Wo Cm  09/06/2011  *RADIOLOGY REPORT*  Clinical Data: Shortness of breath.  CT ANGIOGRAPHY CHEST  Technique:  Multidetector CT imaging of the chest using the standard protocol during bolus administration of intravenous contrast. Multiplanar reconstructed images including MIPs were obtained and reviewed to evaluate the vascular anatomy.  Contrast:   100 ml Omnipaque 300 IV.  Comparison:  09/05/2011  Findings:  Pulmonary emboli are seen bilaterally involving all lobes of both lungs, most pronounced in the lower lobes bilaterally.  Associated trace left pleural effusion.  Left basilar opacity, most likely atelectasis.  No confluent opacity on the right.  Heart is normal size. No mediastinal, hilar, or axillary adenopathy.  Visualized thyroid and chest wall soft tissues unremarkable. Imaging into the upper abdomen shows no acute findings.  IMPRESSION: Numerous bilateral pulmonary emboli, most pronounced in both lower lobes.  Original Report Authenticated By: Cyndie Chime, M.D.  Assessment Present on Admission:  .Pulmonary embolism Vaginal ring birth control  PLAN: Will admit her to the SDU for closer monitoring, although she is quite stable at this time. Initiate intravenous heparin and Coumadin. She does not need complete thrombophilic workup at this time, but will obtain Leiden factor V, homocystine level, and lupus circulating anticoagulant. I also would like to get an echo and bilateral venous Doppler of the lower extremity. She should avoid hormone containing birth control. I discussed at length with her and her husband about pulmonary embolism, treatment options along with risks benefits, and prophylaxis methodologies. She is stable,  full code, and will be admitted to [facility].   Other plans as per orders.    Galvin Aversa 09/06/2011, 1:38 AM

## 2011-09-06 NOTE — Progress Notes (Addendum)
ANTICOAGULATION CONSULT NOTE - Initial Consult  Pharmacy Consult for Heparin and Coumadin Indication: pulmonary embolus  No Known Allergies  Patient Measurements: Height: 5\' 4"  (162.6 cm) Weight: 182 lb 1.6 oz (82.6 kg) IBW/kg (Calculated) : 54.7   Vital Signs: Temp: 98.4 F (36.9 C) (02/24 1151) Temp src: Oral (02/24 1151) BP: 112/83 mmHg (02/24 1300) Pulse Rate: 100  (02/24 1300)  Labs:  Basename 09/06/11 0830 09/05/11 2215 09/05/11 0024  HGB 14.0 14.2 --  HCT 40.0 41.0 --  PLT 234 252 --  APTT -- -- 27  LABPROT -- -- 14.6  INR -- -- 1.12  HEPARINUNFRC 0.41 -- --  CREATININE 0.70 0.71 --  CKTOTAL -- -- --  CKMB -- -- --  TROPONINI -- -- --   Estimated Creatinine Clearance: 104.1 ml/min (by C-G formula based on Cr of 0.7).  Medical History: History reviewed. No pertinent past medical history.  Medications:  Nuvaring  Assessment: 36 yo female with new PE for anticoagulation.  Heparin 5000 units IV bolus, 1250 units/hr started in ED at 1:15 am. Heparin level 0.41 this morning.  Goal of Therapy:  INR 2-3 Heparin level 0.3-0.7 units/ml   Plan:  Continue Heparin at current rate. Follow-up am labs. Coumadin 7.5 mg tonight if decision to go to rivaroxaban is not made  Given age, renal function, and social history, consider using rivaroxaban (Xarelto) for treatment of PE.  Syndey Jaskolski, [place] R 09/06/2011,2:43 PM  After speaking with Dr. Sharon Seller, patient will begin rivaroxaban therapy 2/25 AM. Discontinued warfarin dose for tonight, will continue heparin at current rate until tomorrow AM at 0800 when first dose of rivaroxaban is due.  1. Continue heparin 2. CBC stable, LFT WNL, INR 1.12, CrCl 104.1

## 2011-09-06 NOTE — Progress Notes (Signed)
VASCULAR LAB PRELIMINARY  PRELIMINARY  PRELIMINARY  PRELIMINARY  Bilateral lower extremity venous duplex  completed.    Preliminary report:  Bilateral:  No evidence of DVT, superficial thrombosis, or Baker's Cyst.   Terance Hart, RVT 09/06/2011, 9:11 AM

## 2011-09-06 NOTE — ED Notes (Signed)
Admitting doctor in to see 

## 2011-09-06 NOTE — ED Notes (Signed)
Pt to ct 

## 2011-09-07 LAB — LUPUS ANTICOAGULANT PANEL
DRVVT: 42 secs (ref 34.1–42.2)
Lupus Anticoagulant: NOT DETECTED
PTT Lupus Anticoagulant: 54.2 secs — ABNORMAL HIGH (ref 28.0–43.0)

## 2011-09-07 LAB — CBC
HCT: 40.8 % (ref 36.0–46.0)
Hemoglobin: 14 g/dL (ref 12.0–15.0)
MCV: 89.3 fL (ref 78.0–100.0)
RBC: 4.57 MIL/uL (ref 3.87–5.11)
WBC: 8.9 10*3/uL (ref 4.0–10.5)

## 2011-09-07 NOTE — Progress Notes (Signed)
Utilization Review Completed.Oceane Fosse T2/25/2013   

## 2011-09-07 NOTE — Progress Notes (Signed)
[facility] [place] TEAM 8  Subjective: 36 y.o. female with benign past medical history on NuvaRing, presents to [facility] emergency room complaining of acute shortness of breath and intermittent chest pain since yesterday.  Evaluation in emergency room included a CT scan angiogram which showed bilateral multiple pulmonary emboli especially in the lower lobes.   She is feeling much better today.  She denies chest pain.  She does feel somewhat SOB with exertion, but states that it has improved even since yesterday.  No palpitations or hemoptysis.    Objective: Weight change: 3.1 kg (6 lb 13.4 oz)  Intake/Output Summary (Last 24 hours) at 09/07/11 1505 Last data filed at 09/07/11 0900  Gross per 24 hour  Intake    690 ml  Output    750 ml  Net    -60 ml   Blood pressure 110/67, pulse 88, temperature 98.9 F (37.2 C), temperature source Oral, resp. rate 18, height 5\' 4"  (1.626 m), weight 82.1 kg (181 lb), last menstrual period 08/22/2011, SpO2 98.00%.  Physical Exam: General: No acute respiratory distress Lungs: Clear to auscultation bilaterally without wheezes or crackles Cardiovascular: Regular rate and rhythm without murmur gallop or rub normal S1 and S2-occasional episodes of sinus tach to 120bpm Abdomen: Nontender, nondistended, soft, bowel sounds positive, no rebound, no ascites, no appreciable mass Extremities: No significant cyanosis, clubbing, or edema bilateral lower extremities  Lab Results:  [facility] 09/06/11 0830 09/05/11 2215  NA 140 135  K 4.3 4.0  CL 107 102  CO2 21 19  GLUCOSE 113* 115*  BUN 10 17  CREATININE 0.70 0.71  CALCIUM 9.5 9.9  MG -- --  PHOS -- --    Basename 09/05/11 2215  AST 11  ALT 13  ALKPHOS 75  BILITOT 0.2*  PROT 7.5  ALBUMIN 3.4*    Basename 09/07/11 0552 09/06/11 0830 09/05/11 2215  WBC 8.9 11.3* 13.7*  NEUTROABS -- -- 10.3*  HGB 14.0 14.0 14.2  HCT 40.8 40.0 41.0  MCV 89.3 87.9 88.2  PLT 278 234 252    Micro  Results: Recent Results (from the past 240 hour(s))  MRSA PCR SCREENING     Status: Normal   Collection Time   09/06/11  3:50 AM      Component Value Range Status Comment   MRSA by PCR NEGATIVE  NEGATIVE  Final     Studies/Results: All recent x-ray/radiology reports have been reviewed in detail.   Medications: I have reviewed the patient's complete medication list.  Assessment/Plan:  B numerous pulmonary emboli Precipitated by use of hormonal birth control +/- genetic predisposition +/- being overweight - counseled on need for wgt loss and against further use of hormonal based birth control/HRT - for now, will plan to use  Xarelto  - will need to confirm that she can afford this med/that it is covered by her insurance carrier - if not, will need to swap to coumadin + lovenox and confirm that lovenox will be covered by her insurance/be affordable - if not, will have to remain in hospital for Iv heparin + coumadin - will either need 6 months of anticoag (if genetic testing negative), or life long (if positive) - add prothrombin gene mutation to assay  Possible genetic hypercoagulable state hyercoag w/u pending (grandmother and mother both have hx of DVTs) - homocysteine is normal - Factor V Leiden and Prothrombin gene mutation assays pending, as is lupus anticoag  Overweight Advised on weight loss  Disposition Much more stable  today - transfer to tele bed - begin Xarelto - possible D/C in next 24-48hrs depending on insurance coverage as discussed above  Lonia Blood, MD [facility] Office  [phone] Pager [phone]  On-Call/Text Page:      [email or web address]      password [email or web address]

## 2011-09-08 LAB — CBC
HCT: 41.9 % (ref 36.0–46.0)
Hemoglobin: 13.7 g/dL (ref 12.0–15.0)
MCH: 29.3 pg (ref 26.0–34.0)
MCHC: 32.7 g/dL (ref 30.0–36.0)
MCV: 89.7 fL (ref 78.0–100.0)
RBC: 4.67 MIL/uL (ref 3.87–5.11)

## 2011-09-08 LAB — FACTOR 5 LEIDEN

## 2011-09-08 MED ORDER — RIVAROXABAN 20 MG PO TABS: 20.0000 mg | ORAL_TABLET | Freq: Every day | ORAL | Status: DC

## 2011-09-08 MED ORDER — RIVAROXABAN 15 MG PO TABS: 15.0000 mg | ORAL_TABLET | Freq: Two times a day (BID) | ORAL | Status: DC

## 2011-09-08 NOTE — Discharge Summary (Signed)
DISCHARGE SUMMARY  Catherine Garcia  MR#: [id number]  DOB:Nov 03, 1975  Date of Admission: 09/05/2011 Date of Discharge: 09/08/2011  Attending Physician:Kristyn Obyrne  Patient's PCP:No primary provider on file.  Consults: none  Presenting Complaint: Shortness of breath or chest pain  Discharge Diagnoses: Principal Problem:  *Pulmonary embolism Active Problems:  Uses oral contraceptives    Discharge Medications: Medication List  As of 09/08/2011  2:09 PM   STOP taking these medications         etonogestrel-ethinyl estradiol 0.12-0.015 MG/24HR vaginal ring         TAKE these medications         ALEVE-D SINUS & COLD PO   Take 1 tablet by mouth daily as needed. For cold and sinus      Rivaroxaban 20 MG Tabs   Take 20 mg by mouth daily with supper.      Rivaroxaban 15 MG Tabs tablet   Commonly known as: XARELTO   Take 1 tablet (15 mg total) by mouth 2 (two) times daily with a meal.             Procedures: Dg Chest 2 View  09/05/2011  *RADIOLOGY REPORT*  Clinical Data: 36 year old female with chest pain, cough, pleuritic pain.  CHEST - 2 VIEW  Comparison: None.  Findings: Low lung volumes with bibasilar atelectasis.  No definite pleural effusion.  Cardiac size and mediastinal contours are within normal limits.  Visualized tracheal air column is within normal limits.  No pneumothorax, pulmonary edema or consolidation. Negative visualized bowel gas pattern.  Exaggerated lower thoracic kyphosis.  IMPRESSION: Low lung volumes with bibasilar atelectasis.  Original Report Authenticated By: Harley Hallmark, M.D.   Ct Angio Chest W/cm &/or Wo Cm  09/06/2011  *RADIOLOGY REPORT*  Clinical Data: Shortness of breath.  CT ANGIOGRAPHY CHEST  Technique:  Multidetector CT imaging of the chest using the standard protocol during bolus administration of intravenous contrast. Multiplanar reconstructed images including MIPs were obtained and reviewed to evaluate the vascular anatomy.  Contrast:    100 ml Omnipaque 300 IV.  Comparison:  09/05/2011  Findings:  Pulmonary emboli are seen bilaterally involving all lobes of both lungs, most pronounced in the lower lobes bilaterally.  Associated trace left pleural effusion.  Left basilar opacity, most likely atelectasis.  No confluent opacity on the right.  Heart is normal size. No mediastinal, hilar, or axillary adenopathy.  Visualized thyroid and chest wall soft tissues unremarkable. Imaging into the upper abdomen shows no acute findings.  IMPRESSION: Numerous bilateral pulmonary emboli, most pronounced in both lower lobes.  Original Report Authenticated By: Cyndie Chime, M.D.    Echocardiogram Left ventricle: The cavity size was normal. Systolic function was normal. Wall motion was normal; there were no regional wall motion abnormalities. There was an increased relative contribution of atrial contraction to ventricular filling. Doppler parameters are consistent with abnormal left ventricular relaxation (grade 1 diastolic dysfunction). - Right ventricle: The cavity size was mildly dilated. Wall thickness was normal. Systolic function was moderately reduced.  Venous Duplex of lower extremities: No evidence of deep vein or superficial thrombosis involving the right lower extremity and left lower extremity.     Hospital Course: Principal Problem:  *Pulmonary embolism Active Problems:  Uses oral contraceptives  This is a 36 y/o female who presented to the ER with the complaint of shortness of breath and chest pain starting one day prior to presenting. She does not have a smoking history but does have a NuvaRing. There is  no family history of blood clots that she is aware of. She was found to have multiple PEs and was started on Xarelto which she is tolerating well. She is now no longer short of breath or having any chest pain. She does not require O2 supplementation.  A Lupus Anticoagulant is slightly positive at 54.2 (normal upper  limit being 42). I have spoken with the secretary at the Cancer center and have requested an appointment with a hematologist to follow up on the above result and to order further studies to evaluate for a hypercoagulable state. As noted above, her lower extremity duplex was negative for DVTs.    Day of Discharge Physical Exam: BP 107/76  Pulse 84  Temp(Src) 97.9 F (36.6 C) (Oral)  Resp 20  Ht 5\' 4"  (1.626 m)  Wt 80.423 kg (177 lb 4.8 oz)  BMI 30.43 kg/m2  SpO2 95%  LMP 08/22/2011 General: No acute respiratory distress  Lungs: Clear to auscultation bilaterally without wheezes or crackles  Cardiovascular: Regular rate and rhythm without murmur gallop or rub normal S1 and S2-occasional episodes of sinus tach to 120bpm  Abdomen: Nontender, nondistended, soft, bowel sounds positive, no rebound, no ascites, no appreciable mass  Extremities: No significant cyanosis, clubbing, or edema bilateral lower extremities     Results for orders placed during the hospital encounter of 09/05/11 (from the past 24 hour(s))  CBC     Status: Normal   Collection Time   09/08/11  5:15 AM      Component Value Range   WBC 7.7  4.0 - 10.5 (K/uL)   RBC 4.67  3.87 - 5.11 (MIL/uL)   Hemoglobin 13.7  12.0 - 15.0 (g/dL)   HCT 16.1  09.6 - 04.5 (%)   MCV 89.7  78.0 - 100.0 (fL)   MCH 29.3  26.0 - 34.0 (pg)   MCHC 32.7  30.0 - 36.0 (g/dL)   RDW 40.9  81.1 - 91.4 (%)   Platelets 259  150 - 400 (K/uL)    Disposition: stable   Follow-up Appts: Discharge Orders    Future Orders Please Complete By Expires   Diet - low sodium heart healthy      Increase activity slowly      Discharge instructions      Comments:   Remember to take Xarelto at a dose of 15 mg twice a day for 18 more days and then 20mg  daily.       Tests Needing Follow-up:  - Factor V leiden -Homocysteine, serum  Time on Discharge: >45 min  Signed: Mendy Lapinsky 09/08/2011, 2:09 PM

## 2011-09-08 NOTE — Progress Notes (Signed)
Pt and family  Provided with d/c instructions. Pt verbalized how to take her new medications. No s/s of distress at this time. VSS. Pt left unit in wheel chair. Ramond Craver, RN

## 2011-09-08 NOTE — Progress Notes (Signed)
Pt was ambulated in the hallway about 145ft off of oxygen. O2 sats remained 97-98%. HR remained upper 90s-108. Pt denied SOB or lightheadedness and stated that she "feels back to normal." Ramond Craver, Charity fundraiser

## 2011-09-08 NOTE — Progress Notes (Signed)
I SPOKE WITH THE PT AND SHE STATED THAT SHE CAN HANDLE THE $50.00 30 DAY COPAY FOR XARELTO.  ATTENDING NOTIFIED.  WILL F/U ON DC PLANS. Catherine Garcia 09/08/2011 [phone] OR [phone]

## 2011-09-10 ENCOUNTER — Telehealth: Payer: Self-pay | Admitting: Oncology

## 2011-09-10 NOTE — Telephone Encounter (Signed)
S/w pt re appt for 3/5 @ 1:30 pm.

## 2011-09-11 ENCOUNTER — Telehealth: Payer: Self-pay | Admitting: Oncology

## 2011-09-11 ENCOUNTER — Other Ambulatory Visit: Payer: Self-pay | Admitting: Oncology

## 2011-09-11 DIAGNOSIS — I2699 Other pulmonary embolism without acute cor pulmonale: Secondary | ICD-10-CM

## 2011-09-11 NOTE — ED Provider Notes (Signed)
Medical screening examination/treatment/procedure(s) were conducted as a shared visit with non-physician practitioner(s) and myself.  I personally evaluated the patient during the encounter  See separate documentation  Vida Roller, MD 09/11/11 [phone]

## 2011-09-11 NOTE — Telephone Encounter (Signed)
Del. Ref.Saima Rizwan Dx.Pulmonary Embolus

## 2011-09-15 ENCOUNTER — Ambulatory Visit (HOSPITAL_BASED_OUTPATIENT_CLINIC_OR_DEPARTMENT_OTHER): Payer: BC Managed Care – PPO | Admitting: Oncology

## 2011-09-15 ENCOUNTER — Ambulatory Visit: Payer: BC Managed Care – PPO

## 2011-09-15 ENCOUNTER — Other Ambulatory Visit: Payer: BC Managed Care – PPO | Admitting: Lab

## 2011-09-15 VITALS — BP 131/89 | HR 128 | Temp 98.9°F | Ht 64.0 in | Wt 180.7 lb

## 2011-09-15 DIAGNOSIS — I749 Embolism and thrombosis of unspecified artery: Secondary | ICD-10-CM

## 2011-09-15 DIAGNOSIS — I8290 Acute embolism and thrombosis of unspecified vein: Secondary | ICD-10-CM

## 2011-09-15 DIAGNOSIS — I2699 Other pulmonary embolism without acute cor pulmonale: Secondary | ICD-10-CM

## 2011-09-15 LAB — CBC WITH DIFFERENTIAL/PLATELET
Basophils Absolute: 0.1 10*3/uL (ref 0.0–0.1)
Eosinophils Absolute: 0.1 10*3/uL (ref 0.0–0.5)
LYMPH%: 24.9 % (ref 14.0–49.7)
MCV: 89.3 fL (ref 79.5–101.0)
MONO%: 5.9 % (ref 0.0–14.0)
NEUT#: 6.1 10*3/uL (ref 1.5–6.5)
Platelets: 385 10*3/uL (ref 145–400)
RBC: 4.84 10*6/uL (ref 3.70–5.45)

## 2011-09-15 NOTE — Progress Notes (Signed)
CC:   Noelle Redmon, PA  REASON FOR CONSULTATION:  Thrombosis.  HISTORY OF PRESENT ILLNESS:  This is a pleasant 36 year old female native of [place], living the majority of her life around this area. 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  A relatively healthy woman without really any significant comorbid conditions but presented to the emergency department at [facility] on February 24th with symptoms of chest pain and shortness of breath which started the night before.  Her pain started in the back, subsequently developed flank pain, and again dependent intermittent shortness of breath associated with it.  The patient had reported a long car ride the 1st week in February and she also had been on a NuvaRing as birth control. The patient presented withchest pain and on her evaluation at that time she was found to be tachycardic and subsequently underwent a CT scan of the chest done on 09/06/2011 which showed numerous bilateral pulmonary emboli, most pronounced in both lower lobes.  The patient was hospitalized, as mentioned, between 02/24 to 02/26.  The patient had a Doppler of lower extremity that showed no evidence of any thrombosis.  Echocardiogram showed systolic function to be normal.  The patient was started on Xarelto which she tolerated it very well.  She did not require any O2 supplementation and subsequently was discharged home on dose of 20 mg tablets as well as 15 mg tablets.  The 15 mg tablets are to take by mouth twice a day.  The patient had a hypercoagulable workup that was done which showed it was negative for prothrombin gene mutation.  She had negative factor V Leiden.  She had a negative lupus anticoagulant but her PTT was slightly elevated with the mix did not correct at that time but her DRVVD confirmation was negative.  Again, overall, lupus anticoagulant was negative.  The patient presents today for evaluation for risk stratification  regarding her recent pulmonary embolus.  Clinically, she feels very well.  She is asymptomatic at this point. She is reporting very mild chest discomfort but otherwise feeling relatively well.  She is not reporting any lower extremity swelling. She has stopped birth control pills at this point and has resumed work- related duties.  She does not recall any history of any previous blood clots such as deep vein thrombosis, pulmonary embolus, strokes, migraine headaches or phlebitis.  PAST MEDICAL HISTORY:  Really unremarkable.  She is not reporting any history of hypertension, diabetes, or coronary artery disease.  She has not had any other previous clots or any hospitalizations.  She has had allergies as a child now have resolved.  MEDICATION:  She is also only on Xarelto as well as Aleve as needed.  ALLERGIES:  None.  SOCIAL HISTORY:  She is married.  She does not have any children. Denied any alcohol or tobacco use.  FAMILY HISTORY:  Her mother had a DVT in her 6s.  She also has a history of hypertension.  She had a grandmother who had also deep vein thrombosis and died of stroke.  She had a great-grandmother who also had blood clots although the details are unclear.  Father's history is clear of any blood clots or thrombotic events.  She has 5 siblings, 1 sister had a miscarriage but no other blood clots.  PHYSICAL EXAMINATION:  General:  Alert, awake female appeared in no active distress today.  Vital signs:  Her blood pressure today is 131/89, pulse is 80, respiration 20, and temperature is  98.9.  She weighs 180 pounds.  HEENT:  Head is normocephalic, atraumatic.  Pupils equal, round and reactive to light.  Oral mucosa moist and pink.  Neck: Supple without adenopathy.  Heart:  Regular rate and rhythm.  S1, S2. Lungs:  Clear to auscultation with no rhonchi, wheeze, dullness to percussion.  Abdomen:  Soft, nontender.  No hepatosplenomegaly. Extremities:  No clubbing, cyanosis,  or edema.  Neurological:  Intact motor, sensory, and deep tendon reflexes.  LABORATORY DATA:  Showed a hemoglobin 14.8, white cell count of 9.0, platelet count of 385.  Her lupus anticoagulant testing is currently pending.  ASSESSMENT AND PLAN:  This is a pleasant 36 year old female with the following issues: History of pulmonary embolus:  I had a lengthy discussion today discussing her risks of thrombosis both acquired and inherited.  I feel that her blood clot is certainly provoked at this time.  I feel that she has had both estrogen supplement as an oral contraceptive plus her long travel to low [place] maybe followed by mild dehydration increases the risk dramatically for a deep vein thrombosis.  Otherwise, her hypercoagulable workup has been negative.  I do not think the slight elevation in her PTT is really a meaningful finding but I am repeating her lupus anticoagulant to make sure that is the case.  I think, given the fact that her 1st blood clot is provoked, I would recommend anticoagulation at this point for a total of 6 months and she can safely come off anticoagulation at that time.  In terms of further risk stratification for subsequent blood clots, I think she would have an increased risk of blood clot compared to the general population but certainly avoid certain circumstances that would put her at increased risk of blood loss such as immobilization or orthopedic operation and, if that is the case, if she has to have any operation, early immobilization and aggressive deep vein thrombosis prophylaxis would be warranted.  I certainly encouraged her to use a different method of birth control given her recent blood clot.  Should she become pregnant, then certainly she is at an increased risk of blood clots at that time. For that reason, maybe low-risk prophylaxis with aspirin may be warranted at that time.  I have also encouraged her to avoid long car rides.  If she has  to travel to [place] again, she should stop at least every 2 hours, hydrate aggressively and again use low-dose aspirin once she is off anticoagulation.  All of her questions were answered today.  I will be happy to see her in the future as needed.    ______________________________ Benjiman Core, M.D. FNS/MEDQ  D:  09/15/2011  T:  09/15/2011  Job:  [id number]

## 2011-09-15 NOTE — Progress Notes (Signed)
Note Dictated

## 2011-09-16 LAB — COMPREHENSIVE METABOLIC PANEL
Alkaline Phosphatase: 75 U/L (ref 39–117)
BUN: 16 mg/dL (ref 6–23)
Glucose, Bld: 133 mg/dL — ABNORMAL HIGH (ref 70–99)
Sodium: 140 mEq/L (ref 135–145)
Total Bilirubin: 0.2 mg/dL — ABNORMAL LOW (ref 0.3–1.2)

## 2011-09-16 LAB — LUPUS ANTICOAGULANT PANEL
DRVVT 1:1 Mix: 49.6 secs — ABNORMAL HIGH (ref 34.1–42.2)
DRVVT: 85.5 secs — ABNORMAL HIGH (ref 34.1–42.2)
Drvvt confirmation: 1.54 Ratio — ABNORMAL HIGH (ref <1.16)

## 2011-09-21 ENCOUNTER — Other Ambulatory Visit: Payer: Self-pay | Admitting: Oncology

## 2011-09-21 DIAGNOSIS — I2699 Other pulmonary embolism without acute cor pulmonale: Secondary | ICD-10-CM

## 2011-09-25 ENCOUNTER — Telehealth: Payer: Self-pay | Admitting: Oncology

## 2011-09-25 NOTE — Telephone Encounter (Signed)
called pt with her 8/7 and 8/21 appt   aom

## 2012-02-09 ENCOUNTER — Other Ambulatory Visit (HOSPITAL_COMMUNITY)
Admission: RE | Admit: 2012-02-09 | Discharge: 2012-02-09 | Disposition: A | Discharge status: Home / Self Care | Payer: BC Managed Care – PPO | Source: Ambulatory Visit | Attending: Obstetrics and Gynecology | Admitting: Obstetrics and Gynecology

## 2012-02-09 DIAGNOSIS — Z1151 Encounter for screening for human papillomavirus (HPV): Secondary | ICD-10-CM | POA: Insufficient documentation

## 2012-02-09 DIAGNOSIS — Z01419 Encounter for gynecological examination (general) (routine) without abnormal findings: Secondary | ICD-10-CM | POA: Insufficient documentation

## 2012-02-17 ENCOUNTER — Other Ambulatory Visit (HOSPITAL_BASED_OUTPATIENT_CLINIC_OR_DEPARTMENT_OTHER): Payer: BC Managed Care – PPO | Admitting: Lab

## 2012-02-17 DIAGNOSIS — I2699 Other pulmonary embolism without acute cor pulmonale: Secondary | ICD-10-CM

## 2012-02-18 LAB — LUPUS ANTICOAGULANT PANEL

## 2012-03-02 ENCOUNTER — Ambulatory Visit: Payer: BC Managed Care – PPO | Admitting: Oncology

## 2012-04-05 ENCOUNTER — Ambulatory Visit: Payer: BC Managed Care – PPO | Admitting: Oncology

## 2012-04-21 ENCOUNTER — Telehealth: Payer: Self-pay | Admitting: Oncology

## 2012-04-21 ENCOUNTER — Ambulatory Visit (HOSPITAL_BASED_OUTPATIENT_CLINIC_OR_DEPARTMENT_OTHER): Payer: 59 | Admitting: Oncology

## 2012-04-21 VITALS — BP 126/82 | HR 111 | Temp 98.2°F | Resp 20 | Ht 64.0 in | Wt 186.3 lb

## 2012-04-21 DIAGNOSIS — Z86718 Personal history of other venous thrombosis and embolism: Secondary | ICD-10-CM

## 2012-04-21 DIAGNOSIS — I2699 Other pulmonary embolism without acute cor pulmonale: Secondary | ICD-10-CM

## 2012-04-21 NOTE — Telephone Encounter (Signed)
Gave pt appt for April 2014 lab and MD °

## 2012-04-21 NOTE — Progress Notes (Signed)
Hematology and Oncology Follow Up Visit  Catherine Garcia [id number] June 29, 1976 36 y.o. 04/21/2012 3:23 PM   Principle Diagnosis: 36 year old with PE diagnosed in 08/2011. She was oral contraceptives and provoked by long care ride. She did have a positive LAC in 09/2011.  That was repeated and was negative on 02/2012.    Prior Therapy: She finished 6 months of coumadin therapy.    Interim History:  Ms. Catherine Garcia today for a follow up visit. She completed 6 months of coumadin therapy without complications. She is not reporting any new complications. She is on different contraceptive at this time. Clinically, she feels very well. She is asymptomatic at this point. She is not reporting any chest discomfort. She is not reporting any lower extremity swelling. She has stopped birth control pills at this point and has resumed work- related duties. She does not recall any history of any previous blood clots such as deep vein thrombosis, pulmonary embolus, strokes, migraine headaches or phlebitis.  Medications: I have reviewed the patient's current medications. Current outpatient prescriptions:IUD's (PARAGARD INTRAUTERINE COPPER) IUD IUD, 1 each by Intrauterine route once., Disp: , Rfl: ;  Pseudoephedrine-Naproxen Na (ALEVE-D SINUS & COLD PO), Take 1 tablet by mouth daily as needed. For cold and sinus, Disp: , Rfl:   Allergies: No Known Allergies  Past Medical History, Surgical history, Social history, and Family History were reviewed and updated.  Review of Systems: Constitutional:  Negative for fever, chills, night sweats, anorexia, weight loss, pain. Cardiovascular: no chest pain or dyspnea on exertion Respiratory: negative Neurological: negative Dermatological: negative ENT: negative Skin: Negative. Gastrointestinal: negative Genito-Urinary: negative Hematological and Lymphatic: negative Breast: negative Musculoskeletal: negative Remaining ROS negative. Physical Exam: Blood pressure  126/82, pulse 111, temperature 98.2 F (36.8 C), temperature source Oral, resp. rate 20, height 5\' 4"  (1.626 m), weight 186 lb 4.8 oz (84.505 kg). ECOG: 1 General appearance: alert Head: Normocephalic, without obvious abnormality, atraumatic Neck: no adenopathy, no carotid bruit, no JVD, supple, symmetrical, trachea midline and thyroid not enlarged, symmetric, no tenderness/mass/nodules Lymph nodes: Cervical, supraclavicular, and axillary nodes normal. Heart:regular rate and rhythm, S1, S2 normal, no murmur, click, rub or gallop Lung:chest clear, no wheezing, rales, normal symmetric air entry Abdomin: soft, non-tender, without masses or organomegaly EXT:no erythema, induration, or nodules   Lab Results: Lab Results  Component Value Date   WBC 9.0 09/15/2011   HGB 14.8 09/15/2011   HCT 43.2 09/15/2011   MCV 89.3 09/15/2011   PLT 385 09/15/2011     Chemistry      Component Value Date/Time   NA 140 09/15/2011 1330   K 4.0 09/15/2011 1330   CL 104 09/15/2011 1330   CO2 24 09/15/2011 1330   BUN 16 09/15/2011 1330   CREATININE 0.74 09/15/2011 1330      Component Value Date/Time   CALCIUM 9.6 09/15/2011 1330   ALKPHOS 75 09/15/2011 1330   AST 13 09/15/2011 1330   ALT 28 09/15/2011 1330   BILITOT 0.2* 09/15/2011 1330       Impression and Plan: This is a pleasant 36 year old female with a History of pulmonary embolus diagnosed in 08/2011. This was provoked by long care ride and oral contraceptives.  She did have a positive LAC on labs on 09/2011. This was negative in 08/2011 and repeated again in 02/2012 and was negative again.  I feel it is safe for her to come off anticoagulation.  I educated her about situations that would increase her risk of  thrombosis: these include long travel, orthopedic surgery, hormone therapy and pregnancy.  I will recheck her Lupus anticoagulant in 6 months to make sure that it was indeed a false positive.     Catherine Hose, MD 10/10/20133:23 PM

## 2012-10-11 ENCOUNTER — Other Ambulatory Visit (HOSPITAL_BASED_OUTPATIENT_CLINIC_OR_DEPARTMENT_OTHER): Payer: 59 | Admitting: Lab

## 2012-10-11 DIAGNOSIS — I2699 Other pulmonary embolism without acute cor pulmonale: Secondary | ICD-10-CM

## 2012-10-11 DIAGNOSIS — Z86718 Personal history of other venous thrombosis and embolism: Secondary | ICD-10-CM

## 2012-10-12 LAB — LUPUS ANTICOAGULANT PANEL: PTT Lupus Anticoagulant: 31 secs (ref 28.0–43.0)

## 2012-10-13 ENCOUNTER — Ambulatory Visit (HOSPITAL_BASED_OUTPATIENT_CLINIC_OR_DEPARTMENT_OTHER): Payer: 59 | Admitting: Oncology

## 2012-10-13 VITALS — BP 121/78 | HR 88 | Temp 97.0°F | Resp 18 | Ht 64.0 in | Wt 194.5 lb

## 2012-10-13 DIAGNOSIS — Z86718 Personal history of other venous thrombosis and embolism: Secondary | ICD-10-CM

## 2012-10-13 NOTE — Progress Notes (Signed)
Hematology and Oncology Follow Up Visit  Catherine Garcia [id number] 03-11-1976 36 y.o. 10/13/2012 9:12 AM   Principle Diagnosis: 37 year old with PE diagnosed in 08/2011. She was oral contraceptives and provoked by long care ride. She did have a positive LAC in 09/2011. That was repeated and was negative on 02/2012.    Prior Therapy: She finished 6 months of coumadin therapy in 02/2012.   Interim History:  Catherine Garcia today for a follow up visit. She completed 6 months of coumadin therapy without complications in 02/2012. She is not reporting any new complications. She is on different contraceptive at this time. Clinically, she feels very well. She is asymptomatic at this point. She is not reporting any chest discomfort. She is not reporting any lower extremity swelling. She does not recall any history of any previous blood clots such as deep vein thrombosis, pulmonary embolus, strokes, migraine headaches or phlebitis.  Medications: I have reviewed the patient's current medications. Current outpatient prescriptions:IUD's (PARAGARD INTRAUTERINE COPPER) IUD IUD, 1 each by Intrauterine route once., Disp: , Rfl: ;  Pseudoephedrine-Naproxen Na (ALEVE-D SINUS & COLD PO), Take 1 tablet by mouth daily as needed. For cold and sinus, Disp: , Rfl:   Allergies: No Known Allergies  Past Medical History, Surgical history, Social history, and Family History were reviewed and updated.  Review of Systems: Constitutional:  Negative for fever, chills, night sweats, anorexia, weight loss, pain. Cardiovascular: no chest pain or dyspnea on exertion Respiratory: negative Neurological: negative Dermatological: negative ENT: negative Skin: Negative. Gastrointestinal: negative Genito-Urinary: negative Hematological and Lymphatic: negative Breast: negative Musculoskeletal: negative Remaining ROS negative. Physical Exam: Blood pressure 121/78, pulse 88, temperature 97 F (36.1 C), temperature source Oral,  resp. rate 18, height 5\' 4"  (1.626 m), weight 194 lb 8 oz (88.225 kg). ECOG: 1 General appearance: alert Head: Normocephalic, without obvious abnormality, atraumatic Neck: no adenopathy, no carotid bruit, no JVD, supple, symmetrical, trachea midline and thyroid not enlarged, symmetric, no tenderness/mass/nodules Lymph nodes: Cervical, supraclavicular, and axillary nodes normal. Heart:regular rate and rhythm, S1, S2 normal, no murmur, click, rub or gallop Lung:chest clear, no wheezing, rales, normal symmetric air entry Abdomin: soft, non-tender, without masses or organomegaly EXT:no erythema, induration, or nodules   Lab Results: Lab Results  Component Value Date   WBC 9.0 09/15/2011   HGB 14.8 09/15/2011   HCT 43.2 09/15/2011   MCV 89.3 09/15/2011   PLT 385 09/15/2011     Chemistry      Component Value Date/Time   NA 140 09/15/2011 1330   K 4.0 09/15/2011 1330   CL 104 09/15/2011 1330   CO2 24 09/15/2011 1330   BUN 16 09/15/2011 1330   CREATININE 0.74 09/15/2011 1330      Component Value Date/Time   CALCIUM 9.6 09/15/2011 1330   ALKPHOS 75 09/15/2011 1330   AST 13 09/15/2011 1330   ALT 28 09/15/2011 1330   BILITOT 0.2* 09/15/2011 1330       Impression and Plan: This is a pleasant 37 year old female with a History of pulmonary embolus diagnosed in 08/2011. This was provoked by long care ride and oral contraceptives.  She did have a positive LAC on labs on 09/2011. This was negative in 08/2011 and repeated again in 02/2012 and was negative again.  It was negative again on 10/11/2012. I educated her about situations that would increase her risk of thrombosis: these include long travel, orthopedic surgery, hormone therapy and pregnancy.  She desires pregnancy at this point. I  educated her about possible prophylaxis approaches including aspirin vs Lovenox. I agree with using Lovenox at this time if that is the preferred approach by her Ob/GYN. I will be happy to see her back as needed.    [facility],  MD 4/3/20149:12 AM

## 2013-02-07 IMAGING — CR DG CHEST 2V
2 series · 2 of 2 positions shown · non-contrast
Comparison: None.

CLINICAL DATA: 35-year-old female with chest pain, cough, pleuritic
pain.

CHEST - 2 VIEW

[w chest pa]
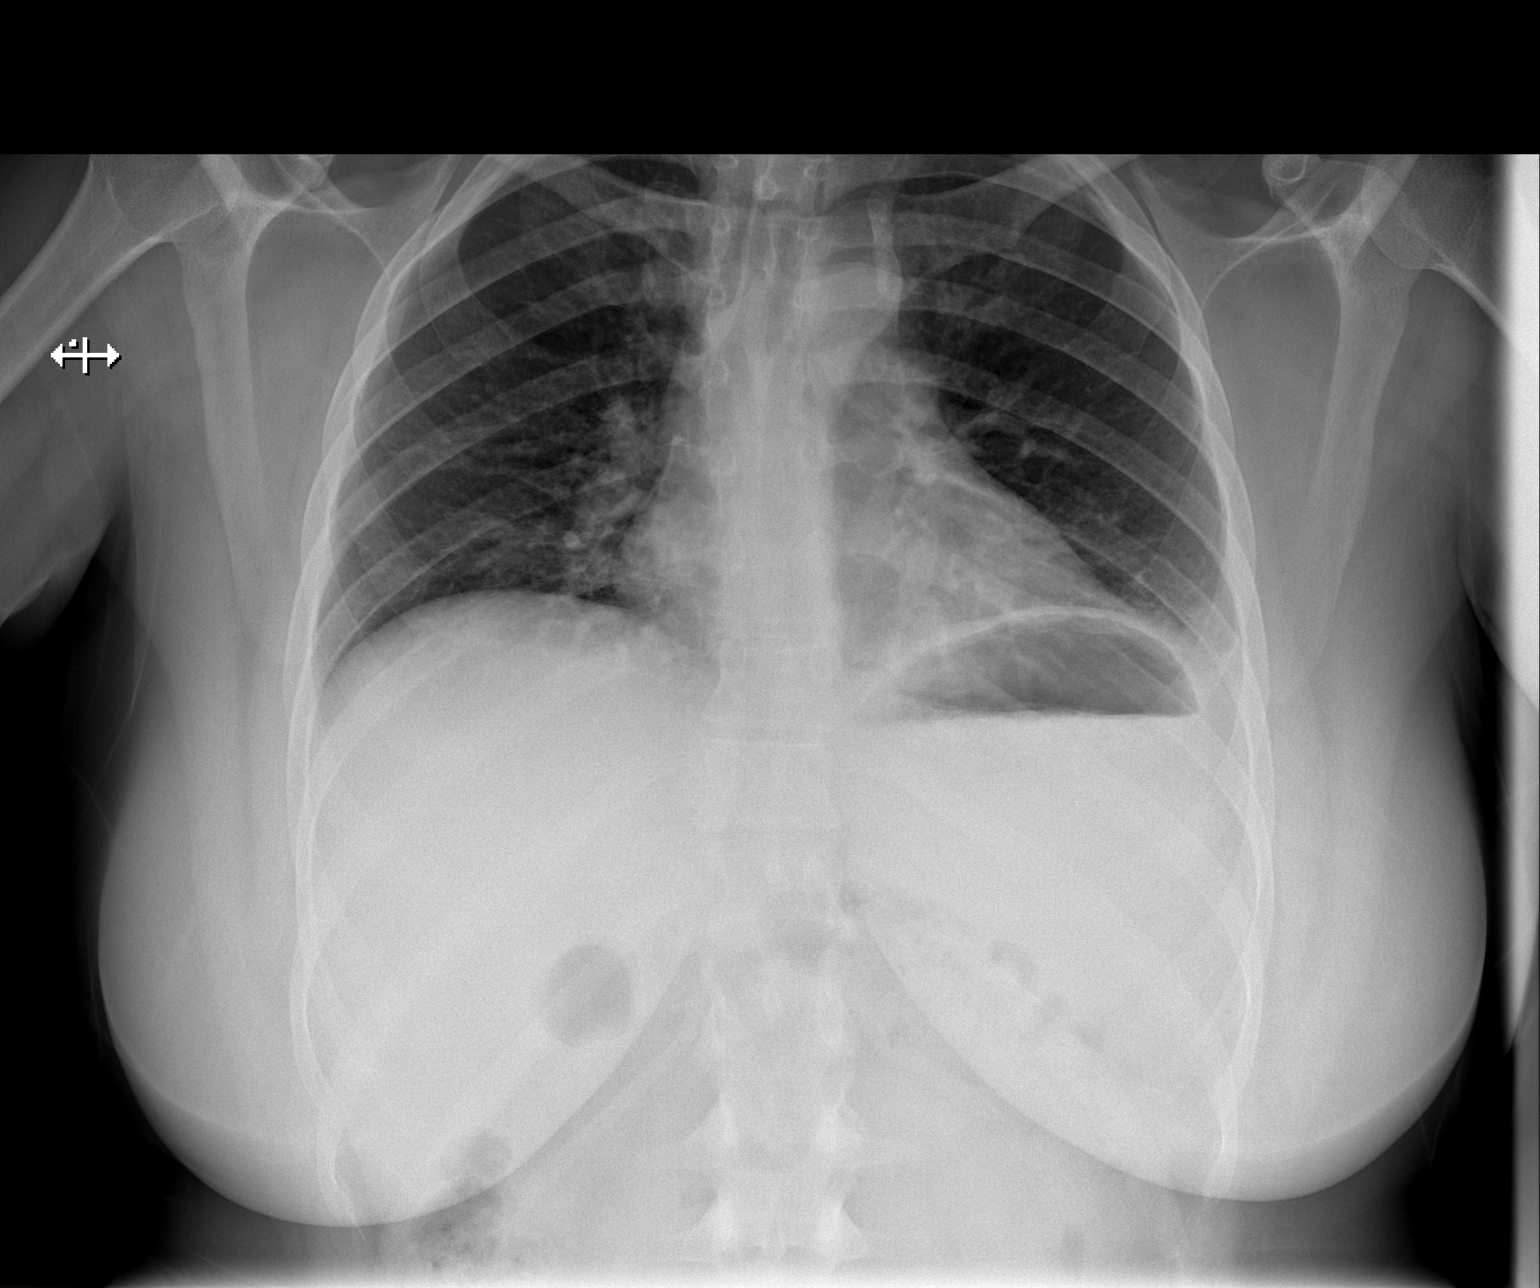

[w chest lat]
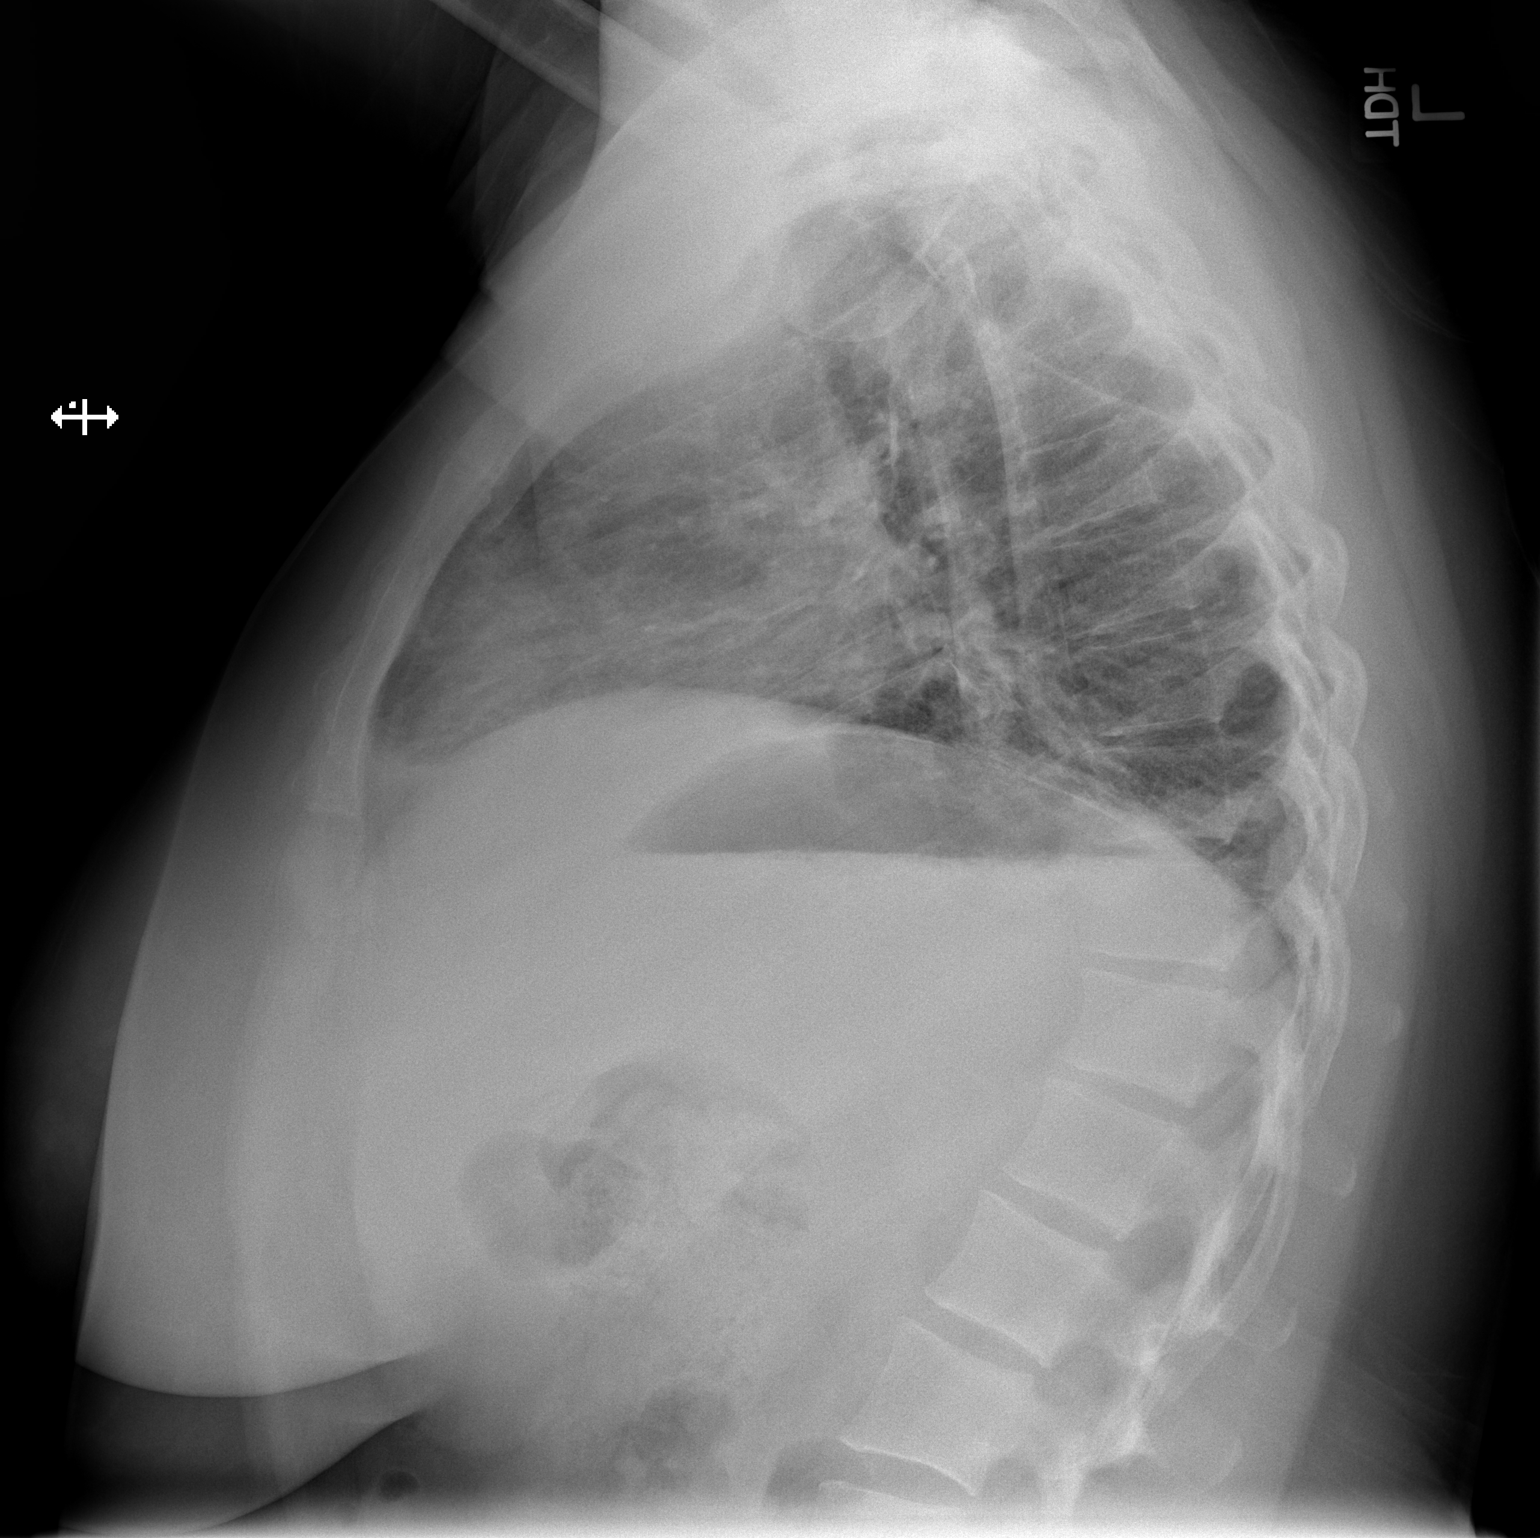

[2 of 2 positions shown; findings below may reference images not displayed]

FINDINGS: Low lung volumes with bibasilar atelectasis.  No definite
pleural effusion.  Cardiac size and mediastinal contours are within
normal limits.  Visualized tracheal air column is within normal
limits.  No pneumothorax, pulmonary edema or consolidation.
Negative visualized bowel gas pattern.  Exaggerated lower thoracic
kyphosis.
IMPRESSION: Low lung volumes with bibasilar atelectasis.

## 2013-03-03 ENCOUNTER — Other Ambulatory Visit: Payer: Self-pay

## 2013-03-09 ENCOUNTER — Encounter (HOSPITAL_COMMUNITY): Payer: Self-pay | Admitting: Obstetrics and Gynecology

## 2013-03-21 ENCOUNTER — Ambulatory Visit (HOSPITAL_COMMUNITY)
Admission: RE | Admit: 2013-03-21 | Discharge: 2013-03-21 | Disposition: A | Discharge status: Home / Self Care | Payer: 59 | Source: Ambulatory Visit | Attending: Obstetrics and Gynecology | Admitting: Obstetrics and Gynecology

## 2013-03-21 DIAGNOSIS — Z86711 Personal history of pulmonary embolism: Secondary | ICD-10-CM | POA: Insufficient documentation

## 2013-03-21 DIAGNOSIS — Z3169 Encounter for other general counseling and advice on procreation: Secondary | ICD-10-CM

## 2013-03-21 NOTE — ED Notes (Signed)
BP-129/81, P-93, wt.-199.5lb

## 2013-03-21 NOTE — Progress Notes (Signed)
MATERNAL FETAL MEDICINE CONSULT  Patient Name: Catherine Garcia Medical Record Number:  [id number] Date of Birth: [birth year] Requesting Physician Name:  Geryl Rankins, MD Date of Service: 03/21/2013  Chief Complaint History of prior pulmonary embolus  History of Present Illness Kamirah A Ferrentino was seen today for preconceptual counseling secondary to a prior pulmonary embolus at the request of Geryl Rankins, MD.  The patient is a 37 y.o. nulligravida who had a pulmonary embolism in February of 2013 after a prolonged car ride and while on Nuvaring for contraception.  She was treated for 6 months with Xarelto.  She had weekly positive anti-phospholipid anti-bodies at the time of the initial pulmonary embolism and was followed closely by Dr. Clelia Croft in Hematology.  She had repeat anti-phospholipid syndrome testing 6 months and 1 year after the event both of which were normal.  She had had no there problems since and currently had a Paragard IUD in place for contraception.  Review of Systems Pertinent items are noted in HPI.  Medical History pulmonary embolism in 2013  Surgical History None.  Social History Ms. Goleman does not use tobacco, alcohol, or other illicit drugs.  Family History Ms. Cashion has no family history of mental retardation, birth defects, or genetic diseases.   Assessment and Recommendations 1.  Prior pulmonary embolism.  Ms. Stroder has already had a thorough thrombophilia workup under the care of Dr. Clelia Croft.  It is clear that she does not have anti-phospholipid syndrome and that her prior pulmonary embolism was likely a consequence of the combination of a prolonged car ride and the use of estrogen containing contraception.  In this situation current guidelines call for either expectant management or prophylactic anti-coagulation with unfractionated or low-molecular weight heparin.  I general favor the use of prophylactic anticoagulation in cases such as Ms. Middendorf's.  This can be  started at approximately 8 weeks of gestation or when fetal cardiac activity is detected in future pregnancies.  I spent 20 minutes with Ms. Ramaker today of which 50% was face-to-face counseling.  Thank you for referring Ms. Opheim to the [facility]  Please do not hesitate to contact us with questions.   Rema Fendt, MD

## 2013-03-22 NOTE — Addendum Note (Signed)
Encounter addended by: Ty Hilts, RN on: 03/22/2013  8:47 AM<BR>     Documentation filed: Charges VN

## 2015-04-08 ENCOUNTER — Other Ambulatory Visit: Payer: Self-pay | Admitting: Obstetrics and Gynecology

## 2015-04-08 ENCOUNTER — Other Ambulatory Visit (HOSPITAL_COMMUNITY)
Admission: RE | Admit: 2015-04-08 | Discharge: 2015-04-08 | Disposition: A | Discharge status: Home / Self Care | Payer: 59 | Source: Ambulatory Visit | Attending: Obstetrics and Gynecology | Admitting: Obstetrics and Gynecology

## 2015-04-08 DIAGNOSIS — Z1151 Encounter for screening for human papillomavirus (HPV): Secondary | ICD-10-CM | POA: Insufficient documentation

## 2015-04-08 DIAGNOSIS — Z01419 Encounter for gynecological examination (general) (routine) without abnormal findings: Secondary | ICD-10-CM | POA: Insufficient documentation

## 2015-04-11 LAB — CYTOLOGY - PAP

## 2015-12-30 LAB — OB RESULTS CONSOLE GC/CHLAMYDIA
Chlamydia: NEGATIVE
Gonorrhea: NEGATIVE

## 2015-12-30 LAB — OB RESULTS CONSOLE ABO/RH: RH TYPE: POSITIVE

## 2015-12-30 LAB — OB RESULTS CONSOLE HIV ANTIBODY (ROUTINE TESTING): HIV: NONREACTIVE

## 2015-12-30 LAB — OB RESULTS CONSOLE RUBELLA ANTIBODY, IGM: RUBELLA: IMMUNE

## 2015-12-30 LAB — OB RESULTS CONSOLE RPR: RPR: NONREACTIVE

## 2015-12-30 LAB — OB RESULTS CONSOLE HEPATITIS B SURFACE ANTIGEN: Hepatitis B Surface Ag: NEGATIVE

## 2015-12-30 LAB — OB RESULTS CONSOLE ANTIBODY SCREEN: Antibody Screen: NEGATIVE

## 2016-06-08 ENCOUNTER — Encounter: Payer: 59 | Attending: Advanced Practice Midwife | Admitting: Skilled Nursing Facility1

## 2016-06-08 DIAGNOSIS — Z713 Dietary counseling and surveillance: Secondary | ICD-10-CM | POA: Diagnosis present

## 2016-06-08 DIAGNOSIS — O24419 Gestational diabetes mellitus in pregnancy, unspecified control: Secondary | ICD-10-CM | POA: Insufficient documentation

## 2016-06-08 DIAGNOSIS — Z3A00 Weeks of gestation of pregnancy not specified: Secondary | ICD-10-CM | POA: Insufficient documentation

## 2016-06-08 DIAGNOSIS — O99810 Abnormal glucose complicating pregnancy: Secondary | ICD-10-CM

## 2016-06-09 ENCOUNTER — Encounter: Payer: Self-pay | Admitting: Skilled Nursing Facility1

## 2016-06-09 NOTE — Progress Notes (Signed)
  Patient was seen on 06/08/2016 for Gestational Diabetes self-management class at the [facility]. The following learning objectives were met by the patient during this course:   States the definition of Gestational Diabetes  States why dietary management is important in controlling blood glucose  Describes the effects each nutrient has on blood glucose levels  Demonstrates ability to create a balanced meal plan  Demonstrates carbohydrate counting   States when to check blood glucose levels involving a total of 4 separate occurences in a day  Demonstrates proper blood glucose monitoring techniques  States the effect of stress and exercise on blood glucose levels  States the importance of limiting caffeine and abstaining from alcohol and smoking  Demonstrates the knowledge the glucometer provided in class may not be covered by their insurance and to call their insurance provider immediately after class to know which glucometer their insurance provider does cover as well as calling their physician the next day for a prescription to the glucometer their insurance does cover (if the one provided is not) as well as the lancets and strips for that meter.  Blood glucose monitor given: pt already had her own Blood glucose reading: 139 having ate 2 hours prior  Patient instructed to monitor glucose levels: FBS: 60 - <90 1 hour: <140 2 hour: <120  *Patient received handouts:  Nutrition Diabetes and Pregnancy  Carbohydrate Counting List  Patient will be seen for follow-up as needed.

## 2016-07-13 NOTE — L&D Delivery Note (Signed)
Operative Delivery Note At 1:44 PM a viable female, "Nathaniel,"was delivered via Vaginal, Vacuum Investment banker, operational(Extractor).  Presentation: vertex; Position: Left,, Occiput,, Anterior  Verbal consent: obtained from patient.  Risks and benefits discussed in detail.  Risks include, but are not limited to the risks of anesthesia, bleeding, infection, damage to maternal tissues, fetal cephalhematoma.  There is also the risk of inability to effect vaginal delivery of the head, or shoulder dystocia that cannot be resolved by established maneuvers, leading to the need for emergency cesarean section.  2 pulls, no pop offs.  APGAR: 7, 8; weight  pending.   Placenta status: Intact ,3 VC .   Cord: Loose nuchal cord x 1, manually reduced with the following complications: .  Cord pH: n/a  Anesthesia:  Epidural, Lidocaine Instruments: Kiwi Vacuum Episiotomy:  None Lacerations: 2nd degree;Perineal Suture Repair: 2.0 3.0 vicryl, 2.0 Chromic Est. Blood Loss (mL):  250  Mom to postpartum.  Baby to Nursery for desaturation. Desire circumcision.  Parents consented.  Geryl RankinsVARNADO, Rayette Mogg 08/11/2016, 2:38 PM

## 2016-07-21 ENCOUNTER — Encounter (HOSPITAL_COMMUNITY): Payer: Self-pay | Admitting: *Deleted

## 2016-07-21 ENCOUNTER — Inpatient Hospital Stay (HOSPITAL_COMMUNITY)
Admission: AD | Admit: 2016-07-21 | Discharge: 2016-07-21 | Disposition: A | Discharge status: Home / Self Care | Payer: 59 | Source: Ambulatory Visit | Attending: Obstetrics and Gynecology | Admitting: Obstetrics and Gynecology

## 2016-07-21 DIAGNOSIS — O24419 Gestational diabetes mellitus in pregnancy, unspecified control: Secondary | ICD-10-CM | POA: Insufficient documentation

## 2016-07-21 DIAGNOSIS — Z3A35 35 weeks gestation of pregnancy: Secondary | ICD-10-CM | POA: Diagnosis not present

## 2016-07-21 DIAGNOSIS — Z79899 Other long term (current) drug therapy: Secondary | ICD-10-CM | POA: Insufficient documentation

## 2016-07-21 DIAGNOSIS — O4703 False labor before 37 completed weeks of gestation, third trimester: Secondary | ICD-10-CM

## 2016-07-21 LAB — URINALYSIS, ROUTINE W REFLEX MICROSCOPIC
BILIRUBIN URINE: NEGATIVE
Glucose, UA: NEGATIVE mg/dL
KETONES UR: 20 mg/dL — AB
LEUKOCYTES UA: NEGATIVE
NITRITE: NEGATIVE
PROTEIN: NEGATIVE mg/dL
Specific Gravity, Urine: 1.005 (ref 1.005–1.030)
pH: 6 (ref 5.0–8.0)

## 2016-07-21 MED ORDER — BETAMETHASONE SOD PHOS & ACET 6 (3-3) MG/ML IJ SUSP
12.0000 mg | INTRAMUSCULAR | Status: DC
  Administered 2016-07-21: 12 mg via INTRAMUSCULAR
  Filled 2016-07-21: qty 2

## 2016-07-21 MED ORDER — NIFEDIPINE 10 MG PO CAPS
10.0000 mg | ORAL_CAPSULE | Freq: Three times a day (TID) | ORAL | Status: DC
  Administered 2016-07-21: 10 mg via ORAL
  Filled 2016-07-21: qty 1

## 2016-07-21 MED ORDER — NIFEDIPINE 10 MG PO CAPS: 10.0000 mg | ORAL_CAPSULE | Freq: Four times a day (QID) | ORAL | 1 refills | Status: DC | PRN

## 2016-07-21 MED ORDER — LACTATED RINGERS IV BOLUS (SEPSIS)
1000.0000 mL | Freq: Once | INTRAVENOUS | Status: AC
  Administered 2016-07-21: 1000 mL via INTRAVENOUS

## 2016-07-21 NOTE — Discharge Instructions (Signed)
Preventing Preterm Birth °Preterm birth is when your baby is delivered between 20 weeks and 37 weeks of pregnancy. A full-term pregnancy lasts for at least 37 weeks. Preterm birth can be dangerous for your baby because the last few weeks of pregnancy are an important time for your baby's brain and lungs to grow. Many things can cause a baby to be born early. Sometimes the cause is not known. There are certain factors that make you more likely to experience preterm birth, such as: °· Having a previous baby born preterm. °· Being pregnant with twins or other multiples. °· Having had fertility treatment. °· Being overweight or underweight at the start of your pregnancy. °· Having any of the following during pregnancy: °¨ An infection, including a urinary tract infection (UTI) or an STI (sexually transmitted infection). °¨ High blood pressure. °¨ Diabetes. °¨ Vaginal bleeding. °· Being age 35 or older. °· Being age 18 or younger. °· Getting pregnant within 6 months of a previous pregnancy. °· Suffering extreme stress or physical or emotional abuse during pregnancy. °· Standing for long periods of time during pregnancy, such as working at a job that requires standing. °What are the risks? °The most serious risk of preterm birth is that the baby may not survive. This is more likely to happen if a baby is born before 34 weeks. Other risks and complications of preterm birth may include your baby having: °· Breathing problems. °· Brain damage that affects movement and coordination (cerebral palsy). °· Feeding difficulties. °· Vision or hearing problems. °· Infections or inflammation of the digestive tract (colitis). °· Developmental delays. °· Learning disabilities. °· Higher risk for diabetes, heart disease, and high blood pressure later in life. °What can I do to lower my risk? °Medical care °The most important thing you can do to lower your risk for preterm birth is to get routine medical care during pregnancy (prenatal  care). If you have a high risk of preterm birth, you may be referred to a health care provider who specializes in managing high-risk pregnancies (perinatologist). You may be given medicine to help prevent preterm birth. °Lifestyle changes °Certain lifestyle changes can also lower your risk of preterm birth: °· Wait at least 6 months after a pregnancy to become pregnant again. °· Try to plan pregnancy for when you are between 19 and 35 years old. °· Get to a healthy weight before getting pregnant. If you are overweight, work with your health care provider to safely lose weight. °· Do not use any products that contain nicotine or tobacco, such as cigarettes and e-cigarettes. If you need help quitting, ask your health care provider. °· Do not drink alcohol. °· Do not use drugs. °Where to find support: °For more support, consider: °· Talking with your health care provider. °· Talking with a therapist or substance abuse counselor, if you need help quitting. °· Working with a diet and nutrition specialist (dietitian) or a personal trainer to maintain a healthy weight. °· Joining a support group. °Where to find more information: °Learn more about preventing preterm birth from: °· Centers for Disease Control and Prevention: cdc.gov/reproductivehealth/maternalinfanthealth/pretermbirth.htm °· March of Dimes: marchofdimes.org/complications/premature-babies.aspx °· American Pregnancy Association: americanpregnancy.org/labor-and-birth/premature-labor °Contact a health care provider if: °· You have any of the following signs of preterm labor before 37 weeks: °¨ A change or increase in vaginal discharge. °¨ Fluid leaking from your vagina. °¨ Pressure or cramps in your lower abdomen. °¨ A backache that does not go away or gets worse. °¨   Regular tightening (contractions) in your lower abdomen. °Summary °· Preterm birth means having your baby during weeks 20-37 of pregnancy. °· Preterm birth may put your baby at risk for physical and  mental problems. °· Getting good prenatal care can help prevent preterm birth. °· You can lower your risk of preterm birth by making certain lifestyle changes, such as not smoking and not using alcohol. °This information is not intended to replace advice given to you by your health care provider. Make sure you discuss any questions you have with your health care provider. °Document Released: 08/13/2015 Document Revised: 03/07/2016 Document Reviewed: 03/07/2016 °Elsevier Interactive Patient Education © 2017 [employer] ° °

## 2016-07-21 NOTE — MAU Note (Signed)
Patient was sent from the office with preterm ctxs. Patient was 1cm/60% in the office. Patient also saw some spotting when she wiped. Patient states that she had sex last night.

## 2016-07-21 NOTE — MAU Provider Note (Signed)
History     CSN: [id number]  Arrival date and time: 07/21/16 1559   First Provider Initiated Contact with Patient 07/21/16 1718      Chief Complaint  Patient presents with  . Contractions   Patient is a 41 year old G1 P0 at 35 weeks and 3 days who is being seen in the office for a NST. She is receiving NST for gestational diabetes and history of blood clot on prophylaxis. She reports she started contracting while on the monitor for her NST and her cervix was checked she was 1 cm. Patient was sent to the MA U for further evaluation. She reports she continues to have some mild tightening of her abdomen but nothing that is painful. She had had no vaginal bleeding or leaking of some spotting upon arrival to the MA U after being checked in the office. She reports that she feels regular fetal movement. She did have intercourse yesterday.    OB History    Gravida Para Term Preterm AB Living   1             SAB TAB Ectopic Multiple Live Births                  History reviewed. No pertinent past medical history.  History reviewed. No pertinent surgical history.  Family History  Problem Relation Age of Onset  . Hypertension Mother   . Diabetes type II Father   . Deep vein thrombosis Other     Social History  Substance Use Topics  . Smoking status: Never Smoker  . Smokeless tobacco: Not on file  . Alcohol use No    Allergies: No Known Allergies  Prescriptions Prior to Admission  Medication Sig Dispense Refill Last Dose  . enoxaparin (LOVENOX) 40 MG/0.4ML injection Inject 40 mg into the skin daily.   0 07/21/2016 at Unknown time  . famotidine (PEPCID) 20 MG tablet Take 20 mg by mouth at bedtime.   0 07/20/2016 at Unknown time  . glyBURIDE (DIABETA) 1.25 MG tablet Take 1.25-2.5 mg by mouth 2 (two) times daily with a meal. Patient takes 1.25 mg in the morning and 2.5 mg in the evening.   07/21/2016 at Unknown time  . Prenatal Vit-Fe Fumarate-FA (PRENATAL MULTIVITAMIN) TABS tablet Take  1 tablet by mouth daily at 12 noon.   07/21/2016 at Unknown time    Review of Systems  Constitutional: Negative for activity change and appetite change.  HENT: Negative for congestion and rhinorrhea.   Respiratory: Negative for chest tightness and shortness of breath.   Gastrointestinal: Negative for abdominal distention, abdominal pain, constipation, diarrhea, nausea and vomiting.  Genitourinary: Negative for difficulty urinating, dysuria and frequency.  Neurological: Negative for dizziness and headaches.   Physical Exam   Blood pressure 128/81, pulse 99, temperature 98.3 F (36.8 C), temperature source Oral, resp. rate 18, last menstrual period 11/16/2015.  Physical Exam  Vitals reviewed. Constitutional: She is oriented to person, place, and time. She appears well-developed and well-nourished.  HENT:  Head: Normocephalic and atraumatic.  Cardiovascular: Normal rate, regular rhythm and intact distal pulses.   Respiratory: Effort normal. No respiratory distress.  GI: Soft. She exhibits no distension. There is no tenderness.  Gravid  Musculoskeletal: Normal range of motion. She exhibits no edema.  Neurological: She is alert and oriented to person, place, and time. No cranial nerve deficit.  Skin: Skin is warm and dry. No rash noted. No erythema.    MAU Course  Procedures  MDM In  MA U patient underwent continuous fetal monitoring which revealed a reactive NST with a baseline heart beat of approximately 140. She did receive 1 L of IV fluids and tell milligrams of Procardia 1. With these interventions she reports she is feeling less tightening in her abdomen. She did report the minimal vaginal bleeding on arrival but has had none since.  -Cervix was checked and appears to be grossly unchanged from exam in office with her cervix being 1/80/ballotable here in MA U  -As discussed with Dr. Richardson Doppole and patient is discharged home  Assessment and Plan  #1: Preterm contractions likely onset  by intercourse last night. Improved with Procardia and IV fluids. Okay to discharge home.  Catherine Garcia 07/21/2016, 5:20 PM

## 2016-07-22 ENCOUNTER — Inpatient Hospital Stay (HOSPITAL_COMMUNITY)
Admission: AD | Admit: 2016-07-22 | Discharge: 2016-07-22 | Disposition: A | Discharge status: Home / Self Care | Payer: 59 | Source: Ambulatory Visit | Attending: Obstetrics and Gynecology | Admitting: Obstetrics and Gynecology

## 2016-07-22 DIAGNOSIS — Z3A00 Weeks of gestation of pregnancy not specified: Secondary | ICD-10-CM | POA: Insufficient documentation

## 2016-07-22 MED ORDER — BETAMETHASONE SOD PHOS & ACET 6 (3-3) MG/ML IJ SUSP
12.0000 mg | Freq: Once | INTRAMUSCULAR | Status: AC
  Administered 2016-07-22: 12 mg via INTRAMUSCULAR
  Filled 2016-07-22: qty 2

## 2016-07-28 LAB — OB RESULTS CONSOLE GBS: GBS: POSITIVE

## 2016-08-07 ENCOUNTER — Telehealth (HOSPITAL_COMMUNITY): Payer: Self-pay | Admitting: *Deleted

## 2016-08-07 ENCOUNTER — Encounter (HOSPITAL_COMMUNITY): Payer: Self-pay | Admitting: *Deleted

## 2016-08-07 NOTE — Telephone Encounter (Signed)
Preadmission screen  

## 2016-08-10 ENCOUNTER — Encounter (HOSPITAL_COMMUNITY): Payer: Self-pay

## 2016-08-10 ENCOUNTER — Inpatient Hospital Stay (HOSPITAL_COMMUNITY): Payer: 59 | Admitting: Anesthesiology

## 2016-08-10 ENCOUNTER — Inpatient Hospital Stay (HOSPITAL_COMMUNITY)
Admission: AD | Admit: 2016-08-10 | Discharge: 2016-08-13 | DRG: 775 | Disposition: A | Discharge status: Home / Self Care | Payer: 59 | Source: Ambulatory Visit | Attending: Obstetrics and Gynecology | Admitting: Obstetrics and Gynecology

## 2016-08-10 DIAGNOSIS — Z833 Family history of diabetes mellitus: Secondary | ICD-10-CM | POA: Diagnosis not present

## 2016-08-10 DIAGNOSIS — Z8249 Family history of ischemic heart disease and other diseases of the circulatory system: Secondary | ICD-10-CM | POA: Diagnosis not present

## 2016-08-10 DIAGNOSIS — Z86711 Personal history of pulmonary embolism: Secondary | ICD-10-CM

## 2016-08-10 DIAGNOSIS — Z3493 Encounter for supervision of normal pregnancy, unspecified, third trimester: Secondary | ICD-10-CM | POA: Diagnosis present

## 2016-08-10 DIAGNOSIS — O99824 Streptococcus B carrier state complicating childbirth: Secondary | ICD-10-CM | POA: Diagnosis present

## 2016-08-10 DIAGNOSIS — O24425 Gestational diabetes mellitus in childbirth, controlled by oral hypoglycemic drugs: Secondary | ICD-10-CM | POA: Diagnosis present

## 2016-08-10 DIAGNOSIS — Z3A38 38 weeks gestation of pregnancy: Secondary | ICD-10-CM

## 2016-08-10 LAB — GLUCOSE, RANDOM: Glucose, Bld: 124 mg/dL — ABNORMAL HIGH (ref 65–99)

## 2016-08-10 LAB — CBC
HEMATOCRIT: 44 % (ref 36.0–46.0)
Hemoglobin: 15.7 g/dL — ABNORMAL HIGH (ref 12.0–15.0)
MCH: 31.4 pg (ref 26.0–34.0)
MCHC: 35.7 g/dL (ref 30.0–36.0)
MCV: 88 fL (ref 78.0–100.0)
PLATELETS: 262 10*3/uL (ref 150–400)
RBC: 5 MIL/uL (ref 3.87–5.11)
RDW: 14.1 % (ref 11.5–15.5)
WBC: 19.1 10*3/uL — AB (ref 4.0–10.5)

## 2016-08-10 LAB — GLUCOSE, CAPILLARY: GLUCOSE-CAPILLARY: 130 mg/dL — AB (ref 65–99)

## 2016-08-10 LAB — TYPE AND SCREEN
ABO/RH(D): B POS
Antibody Screen: NEGATIVE

## 2016-08-10 LAB — ABO/RH: ABO/RH(D): B POS

## 2016-08-10 MED ORDER — EPHEDRINE 5 MG/ML INJ
10.0000 mg | INTRAVENOUS | Status: DC | PRN
  Filled 2016-08-10: qty 4

## 2016-08-10 MED ORDER — FENTANYL 2.5 MCG/ML BUPIVACAINE 1/10 % EPIDURAL INFUSION (WH - ANES)
14.0000 mL/h | INTRAMUSCULAR | Status: DC | PRN
  Administered 2016-08-10 – 2016-08-11 (×2): 14 mL/h via EPIDURAL
  Filled 2016-08-10 (×2): qty 100

## 2016-08-10 MED ORDER — LIDOCAINE HCL (PF) 1 % IJ SOLN
30.0000 mL | INTRAMUSCULAR | Status: AC | PRN
  Administered 2016-08-11: 30 mL via SUBCUTANEOUS
  Filled 2016-08-10: qty 30

## 2016-08-10 MED ORDER — DIPHENHYDRAMINE HCL 50 MG/ML IJ SOLN: 12.5000 mg | INTRAMUSCULAR | Status: DC | PRN

## 2016-08-10 MED ORDER — OXYTOCIN 40 UNITS IN LACTATED RINGERS INFUSION - SIMPLE MED
2.5000 [IU]/h | INTRAVENOUS | Status: DC
  Filled 2016-08-10: qty 1000

## 2016-08-10 MED ORDER — OXYCODONE-ACETAMINOPHEN 5-325 MG PO TABS: 2.0000 | ORAL_TABLET | ORAL | Status: DC | PRN

## 2016-08-10 MED ORDER — OXYTOCIN BOLUS FROM INFUSION
500.0000 mL | Freq: Once | INTRAVENOUS | Status: AC
  Administered 2016-08-11: 500 mL via INTRAVENOUS

## 2016-08-10 MED ORDER — LACTATED RINGERS IV SOLN
INTRAVENOUS | Status: DC
  Administered 2016-08-10: 1000 mL via INTRAVENOUS
  Administered 2016-08-11 (×2): via INTRAVENOUS

## 2016-08-10 MED ORDER — ONDANSETRON HCL 4 MG/2ML IJ SOLN: 4.0000 mg | Freq: Four times a day (QID) | INTRAMUSCULAR | Status: DC | PRN

## 2016-08-10 MED ORDER — ACETAMINOPHEN 325 MG PO TABS: 650.0000 mg | ORAL_TABLET | ORAL | Status: DC | PRN

## 2016-08-10 MED ORDER — LIDOCAINE HCL (PF) 1 % IJ SOLN
INTRAMUSCULAR | Status: DC | PRN
  Administered 2016-08-10 (×2): 5 mL

## 2016-08-10 MED ORDER — PHENYLEPHRINE 40 MCG/ML (10ML) SYRINGE FOR IV PUSH (FOR BLOOD PRESSURE SUPPORT)
80.0000 ug | PREFILLED_SYRINGE | INTRAVENOUS | Status: DC | PRN
  Filled 2016-08-10: qty 5

## 2016-08-10 MED ORDER — LACTATED RINGERS IV SOLN: 500.0000 mL | INTRAVENOUS | Status: DC | PRN

## 2016-08-10 MED ORDER — PHENYLEPHRINE 40 MCG/ML (10ML) SYRINGE FOR IV PUSH (FOR BLOOD PRESSURE SUPPORT)
80.0000 ug | PREFILLED_SYRINGE | INTRAVENOUS | Status: DC | PRN
  Filled 2016-08-10: qty 10
  Filled 2016-08-10: qty 5

## 2016-08-10 MED ORDER — OXYCODONE-ACETAMINOPHEN 5-325 MG PO TABS: 1.0000 | ORAL_TABLET | ORAL | Status: DC | PRN

## 2016-08-10 MED ORDER — LACTATED RINGERS IV SOLN
500.0000 mL | Freq: Once | INTRAVENOUS | Status: AC
  Administered 2016-08-10: 500 mL via INTRAVENOUS

## 2016-08-10 MED ORDER — SOD CITRATE-CITRIC ACID 500-334 MG/5ML PO SOLN
30.0000 mL | ORAL | Status: DC | PRN
  Administered 2016-08-10: 30 mL via ORAL
  Filled 2016-08-10: qty 15

## 2016-08-10 MED ORDER — PENICILLIN G POTASSIUM 5000000 UNITS IJ SOLR
5.0000 10*6.[IU] | Freq: Once | INTRAVENOUS | Status: AC
  Administered 2016-08-10: 5 10*6.[IU] via INTRAVENOUS
  Filled 2016-08-10: qty 5

## 2016-08-10 MED ORDER — PENICILLIN G POT IN DEXTROSE 60000 UNIT/ML IV SOLN
3.0000 10*6.[IU] | INTRAVENOUS | Status: DC
  Administered 2016-08-10 – 2016-08-11 (×4): 3 10*6.[IU] via INTRAVENOUS
  Filled 2016-08-10 (×7): qty 50

## 2016-08-10 NOTE — Anesthesia Preprocedure Evaluation (Signed)
Anesthesia Evaluation  Patient identified by MRN, date of birth, ID band Patient awake    Reviewed: Allergy & Precautions, H&P , NPO status , Patient's Chart, lab work & pertinent test results  History of Anesthesia Complications Negative for: history of anesthetic complications  Airway Mallampati: II  TM Distance: >3 FB Neck ROM: full    Dental no notable dental hx. (+) Teeth Intact   Pulmonary PE (on SQ heparin. last dose 24 hrs ago)   Pulmonary exam normal breath sounds clear to auscultation       Cardiovascular negative cardio ROS Normal cardiovascular exam Rhythm:regular Rate:Normal     Neuro/Psych negative neurological ROS  negative psych ROS   GI/Hepatic negative GI ROS, Neg liver ROS,   Endo/Other  diabetes, Gestational  Renal/GU negative Renal ROS  negative genitourinary   Musculoskeletal   Abdominal   Peds  Hematology negative hematology ROS (+)   Anesthesia Other Findings   Reproductive/Obstetrics (+) Pregnancy                             Anesthesia Physical Anesthesia Plan  ASA: II  Anesthesia Plan: Epidural   Post-op Pain Management:    Induction:   Airway Management Planned:   Additional Equipment:   Intra-op Plan:   Post-operative Plan:   Informed Consent: I have reviewed the patients History and Physical, chart, labs and discussed the procedure including the risks, benefits and alternatives for the proposed anesthesia with the patient or authorized representative who has indicated his/her understanding and acceptance.     Plan Discussed with:   Anesthesia Plan Comments:         Anesthesia Quick Evaluation

## 2016-08-10 NOTE — Anesthesia Pain Management Evaluation Note (Addendum)
  CRNA Pain Management Visit Note  Patient: Catherine Garcia, 41 y.o., female  "Hello I am a member of the anesthesia team at [facility]. We have an anesthesia team available at all times to provide care throughout the hospital, including epidural management and anesthesia for C-section. I don't know your plan for the delivery whether it a natural birth, water birth, IV sedation, nitrous supplementation, doula or epidural, but we want to meet your pain goals."   1.Was your pain managed to your expectations on prior hospitalizations?   No prior hospitalizations  2.What is your expectation for pain management during this hospitalization?     Epidural  3.How can we help you reach that goal? Maintain epidural.  Record the patient's initial score and the patient's pain goal.   Pain: 1  Pain Goal: 0 [facility] wants you to be able to say your pain was always managed very well.  [name] 08/10/2016

## 2016-08-10 NOTE — H&P (Signed)
Catherine Garcia is a 40 y.o. female G1 P0 at 38 wks and 2 days  presenting complaining of regular contractions. Cervix 4 cm in MAU by nurse exam.  Pt has received prenatal care with Dr. Dion Body at [facility]. 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 And hx of pulmonary embolism in 2012. She was on lovenox however was switched to heparin 5000 units [place] bid 2 days ago. Her last dose of Heparin was last night at 9 pm.  She denies LOF . .. Some bloody show + FM  . OB History    Gravida Para Term Preterm AB Living   1         0   SAB TAB Ectopic Multiple Live Births                 Past Medical History:  Diagnosis Date  . Gestational diabetes    glyburide  . History of thrombophlebitis    Hx of pulmonary embolism  Past Surgical History:  Procedure Laterality Date  . NO PAST SURGERIES     Family History: family history includes Deep vein thrombosis in her mother and other; Diabetes type II in her father; Hypertension in her mother. Social History:  reports that she has never smoked. She has never used smokeless tobacco. She reports that she does not drink alcohol. Her drug history is not on file.     Maternal Diabetes: Yes:  Diabetes Type:  Insulin/Medication controlled Genetic Screening: Normal Maternal Ultrasounds/Referrals: Normal Fetal Ultrasounds or other Referrals:  None Maternal Substance Abuse:  No Significant Maternal Medications:  Meds include: Other: glyburide  Significant Maternal Lab Results:  Lab values include: Group B Strep positive Other Comments:  None  Review of Systems  Constitutional: Negative.   HENT: Negative.   Eyes: Negative.   Respiratory: Negative.   Cardiovascular: Negative.   Gastrointestinal: Negative.   Genitourinary: Negative.   Musculoskeletal: Negative.   Skin: Negative.   Neurological: Negative.   Endo/Heme/Allergies: Negative.   Psychiatric/Behavioral: Negative.    Maternal Medical History:   Reason for admission: Contractions.   Contractions: Onset was 6-12 hours ago.   Frequency: regular.   Perceived severity is strong.    Fetal activity: Perceived fetal activity is normal.   Last perceived fetal movement was within the past hour.    Prenatal complications: Hx of Pulmonary Embolism in 2012   Prenatal Complications - Diabetes: gestational. Diabetes is managed by oral agent (monotherapy).      Dilation: 4 Effacement (%): 90 Station: -1 Exam by:: [email or web address],[email or web address] Blood pressure 116/84, pulse 94, temperature 98 F (36.7 C), temperature source Oral, resp. rate 18, height 5\' 4"  (1.626 m), weight 88 kg (194 lb), last menstrual period 11/16/2015, SpO2 96 %. Maternal Exam:  Uterine Assessment: Contraction strength is firm.  Contraction frequency is regular.   Abdomen: Patient reports no abdominal tenderness. Fetal presentation: vertex     Fetal Exam Fetal Monitor Review: Baseline rate: 120.  Variability: moderate (6-25 bpm).   Pattern: accelerations present and no decelerations.    Fetal State Assessment: Category I - tracings are normal.     Physical Exam  Vitals reviewed. Constitutional: She is oriented to person, place, and time. She appears well-developed and well-nourished.  HENT:  Head: Normocephalic and atraumatic.  Eyes: Conjunctivae are normal. Pupils are equal, round, and reactive to light.  Neck: Normal range of motion. Neck supple.  Cardiovascular: Normal rate and regular  rhythm.   Respiratory: Effort normal and breath sounds normal.  GI: There is no tenderness.  Musculoskeletal: She exhibits no edema.  Neurological: She is alert and oriented to person, place, and time. She has normal reflexes.  Skin: Skin is warm and dry.  Psychiatric: She has a normal mood and affect.    Prenatal labs: ABO, Rh: B/Positive/-- (06/19 0000) Antibody: Negative (06/19 0000) Rubella: Immune (06/19 0000) RPR: Nonreactive (06/19 0000)  HBsAg: Negative (06/19 0000)   HIV: Non-reactive (06/19 0000)  GBS: Positive (01/16 0000)   Assessment/Plan: 38 wks and 2 days in Active Labor  Penicillin for GBS prophylaxis  Epidural for pain control  A2 DM- CBG every 4 hours  Hx PE - scd's restart lovenox post delivery  Dr. Dion BodyVarnado assuming care at 7 am on 08/11/2016   Catherine Garcia J. 08/10/2016, 7:44 PM

## 2016-08-10 NOTE — Anesthesia Procedure Notes (Signed)
Epidural Patient location during procedure: OB  Staffing Anesthesiologist: Sinthia Karabin Performed: anesthesiologist   Preanesthetic Checklist Completed: patient identified, site marked, surgical consent, pre-op evaluation, timeout performed, IV checked, risks and benefits discussed and monitors and equipment checked  Epidural Patient position: sitting Prep: DuraPrep Patient monitoring: heart rate, continuous pulse ox and blood pressure Approach: right paramedian Location: L3-L4 Injection technique: LOR saline  Needle:  Needle type: Tuohy  Needle gauge: 17 G Needle length: 9 cm and 9 Needle insertion depth: 6 cm Catheter type: closed end flexible Catheter size: 20 Guage Catheter at skin depth: 10 cm Test dose: negative  Assessment Events: blood not aspirated, injection not painful, no injection resistance, negative IV test and no paresthesia  Additional Notes Patient identified. Risks/Benefits/Options discussed with patient including but not limited to bleeding, infection, nerve damage, paralysis, failed block, incomplete pain control, headache, blood pressure changes, nausea, vomiting, reactions to medication both or allergic, itching and postpartum back pain. Confirmed with bedside nurse the patient's most recent platelet count. Confirmed with patient that they are not currently taking any anticoagulation, have any bleeding history or any family history of bleeding disorders. Patient expressed understanding and wished to proceed. All questions were answered. Sterile technique was used throughout the entire procedure. Please see nursing notes for vital signs. Test dose was given through epidural needle and negative prior to continuing to dose epidural or start infusion. Warning signs of high block given to the patient including shortness of breath, tingling/numbness in hands, complete motor block, or any concerning symptoms with instructions to call for help. Patient was given  instructions on fall risk and not to get out of bed. All questions and concerns addressed with instructions to call with any issues.     

## 2016-08-10 NOTE — MAU Note (Signed)
Pt c/o contractions since 0600 this morning that are now 3 minutes apart. Pt states she was at Dr Ginnie SmartVarnado's office today and was 1.5cm dilated. Pt denies bleeding and leaking of fluid. Pt states baby is moving normally.

## 2016-08-11 ENCOUNTER — Encounter (HOSPITAL_COMMUNITY): Payer: Self-pay | Admitting: *Deleted

## 2016-08-11 LAB — GLUCOSE, CAPILLARY
GLUCOSE-CAPILLARY: 101 mg/dL — AB (ref 65–99)
Glucose-Capillary: 104 mg/dL — ABNORMAL HIGH (ref 65–99)

## 2016-08-11 LAB — RPR: RPR Ser Ql: NONREACTIVE

## 2016-08-11 MED ORDER — ONDANSETRON HCL 4 MG/2ML IJ SOLN: 4.0000 mg | INTRAMUSCULAR | Status: DC | PRN

## 2016-08-11 MED ORDER — FAMOTIDINE 20 MG PO TABS
20.0000 mg | ORAL_TABLET | Freq: Every day | ORAL | Status: DC
  Administered 2016-08-11 – 2016-08-12 (×2): 20 mg via ORAL
  Filled 2016-08-11 (×4): qty 1

## 2016-08-11 MED ORDER — ZOLPIDEM TARTRATE 5 MG PO TABS: 5.0000 mg | ORAL_TABLET | Freq: Every evening | ORAL | Status: DC | PRN

## 2016-08-11 MED ORDER — OXYTOCIN 40 UNITS IN LACTATED RINGERS INFUSION - SIMPLE MED
1.0000 m[IU]/min | INTRAVENOUS | Status: DC
  Administered 2016-08-11: 1 m[IU]/min via INTRAVENOUS

## 2016-08-11 MED ORDER — ONDANSETRON HCL 4 MG PO TABS: 4.0000 mg | ORAL_TABLET | ORAL | Status: DC | PRN

## 2016-08-11 MED ORDER — OXYCODONE-ACETAMINOPHEN 5-325 MG PO TABS: 1.0000 | ORAL_TABLET | ORAL | Status: DC | PRN

## 2016-08-11 MED ORDER — MAGNESIUM HYDROXIDE 400 MG/5ML PO SUSP: 30.0000 mL | ORAL | Status: DC | PRN

## 2016-08-11 MED ORDER — BENZOCAINE-MENTHOL 20-0.5 % EX AERO: 1.0000 "application " | INHALATION_SPRAY | CUTANEOUS | Status: DC | PRN

## 2016-08-11 MED ORDER — OXYTOCIN 40 UNITS IN LACTATED RINGERS INFUSION - SIMPLE MED: 2.5000 [IU]/h | INTRAVENOUS | Status: DC | PRN

## 2016-08-11 MED ORDER — IBUPROFEN 600 MG PO TABS
600.0000 mg | ORAL_TABLET | Freq: Four times a day (QID) | ORAL | Status: DC
  Administered 2016-08-11 – 2016-08-13 (×8): 600 mg via ORAL
  Filled 2016-08-11 (×8): qty 1

## 2016-08-11 MED ORDER — SENNOSIDES-DOCUSATE SODIUM 8.6-50 MG PO TABS
2.0000 | ORAL_TABLET | ORAL | Status: DC
  Administered 2016-08-12 (×2): 2 via ORAL
  Filled 2016-08-11 (×2): qty 2

## 2016-08-11 MED ORDER — DIBUCAINE 1 % RE OINT: 1.0000 "application " | TOPICAL_OINTMENT | RECTAL | Status: DC | PRN

## 2016-08-11 MED ORDER — ACETAMINOPHEN 325 MG PO TABS
650.0000 mg | ORAL_TABLET | ORAL | Status: DC | PRN
  Administered 2016-08-11: 650 mg via ORAL
  Filled 2016-08-11: qty 2

## 2016-08-11 MED ORDER — FAMOTIDINE IN NACL 20-0.9 MG/50ML-% IV SOLN
20.0000 mg | Freq: Once | INTRAVENOUS | Status: AC
  Administered 2016-08-11: 20 mg via INTRAVENOUS
  Filled 2016-08-11: qty 50

## 2016-08-11 MED ORDER — DOCUSATE SODIUM 100 MG PO CAPS
100.0000 mg | ORAL_CAPSULE | Freq: Two times a day (BID) | ORAL | Status: DC
  Administered 2016-08-11 – 2016-08-13 (×4): 100 mg via ORAL
  Filled 2016-08-11 (×4): qty 1

## 2016-08-11 MED ORDER — ENOXAPARIN SODIUM 40 MG/0.4ML ~~LOC~~ SOLN
40.0000 mg | SUBCUTANEOUS | Status: DC
  Administered 2016-08-12 – 2016-08-13 (×2): 40 mg via SUBCUTANEOUS
  Filled 2016-08-11 (×4): qty 0.4

## 2016-08-11 MED ORDER — TERBUTALINE SULFATE 1 MG/ML IJ SOLN
0.2500 mg | Freq: Once | INTRAMUSCULAR | Status: DC | PRN
  Filled 2016-08-11: qty 1

## 2016-08-11 MED ORDER — MISOPROSTOL 200 MCG PO TABS
ORAL_TABLET | ORAL | Status: AC
  Filled 2016-08-11: qty 5

## 2016-08-11 MED ORDER — WITCH HAZEL-GLYCERIN EX PADS: 1.0000 "application " | MEDICATED_PAD | CUTANEOUS | Status: DC | PRN

## 2016-08-11 MED ORDER — DIPHENHYDRAMINE HCL 25 MG PO CAPS: 25.0000 mg | ORAL_CAPSULE | Freq: Four times a day (QID) | ORAL | Status: DC | PRN

## 2016-08-11 MED ORDER — TETANUS-DIPHTH-ACELL PERTUSSIS 5-2.5-18.5 LF-MCG/0.5 IM SUSP: 0.5000 mL | Freq: Once | INTRAMUSCULAR | Status: DC

## 2016-08-11 MED ORDER — SIMETHICONE 80 MG PO CHEW
80.0000 mg | CHEWABLE_TABLET | ORAL | Status: DC | PRN
Start: 2016-08-11 — End: 2016-08-13

## 2016-08-11 MED ORDER — COCONUT OIL OIL
1.0000 "application " | TOPICAL_OIL | Status: DC | PRN
  Administered 2016-08-12: 1 via TOPICAL
  Filled 2016-08-11: qty 120

## 2016-08-11 MED ORDER — METHYLERGONOVINE MALEATE 0.2 MG PO TABS: 0.2000 mg | ORAL_TABLET | ORAL | Status: DC | PRN

## 2016-08-11 MED ORDER — PRENATAL MULTIVITAMIN CH
1.0000 | ORAL_TABLET | Freq: Every day | ORAL | Status: DC
  Administered 2016-08-12 – 2016-08-13 (×2): 1 via ORAL
  Filled 2016-08-11 (×2): qty 1

## 2016-08-11 MED ORDER — OXYCODONE-ACETAMINOPHEN 5-325 MG PO TABS: 2.0000 | ORAL_TABLET | ORAL | Status: DC | PRN

## 2016-08-11 MED ORDER — METHYLERGONOVINE MALEATE 0.2 MG/ML IJ SOLN
0.2000 mg | Freq: Once | INTRAMUSCULAR | Status: AC
  Administered 2016-08-11: 0.2 mg via INTRAMUSCULAR
  Filled 2016-08-11: qty 1

## 2016-08-11 MED ORDER — METHYLERGONOVINE MALEATE 0.2 MG/ML IJ SOLN: 0.2000 mg | INTRAMUSCULAR | Status: DC | PRN

## 2016-08-11 NOTE — Progress Notes (Signed)
SCD's applied per order.

## 2016-08-11 NOTE — Progress Notes (Signed)
Catherine Garcia is a 41 y.o. G1P0 at 174w3d   Subjective: Pt comfortable.  Not feeling pressure.  Objective: BP 118/78   Pulse (!) 107   Temp 98.6 F (37 C) (Oral)   Resp 18   Ht 5\' 4"  (1.626 m)   Wt 88 kg (194 lb)   LMP 11/16/2015   SpO2 100%   BMI 33.30 kg/m  I/O last 3 completed shifts: In: -  Out: 550 [Urine:550] No intake/output data recorded.  FHT:  FHR: 130s bpm, variability: moderate,  accelerations:  Present,  decelerations:  Absent UC:   Couplets every 3-5 minutes SVE:   Dilation: 10 Effacement (%): 100 Station: +1, +2 Exam by:: Terique Kawabata Baby comes down to +2 with contraction. Bedside ultrasound-Not OP. Labs: Lab Results  Component Value Date   WBC 19.1 (H) 08/10/2016   HGB 15.7 (H) 08/10/2016   HCT 44.0 08/10/2016   MCV 88.0 08/10/2016   PLT 262 08/10/2016    Assessment / Plan: Augmentation of labor, progressing well Contractions getting stronger.  Labor: Start pushing at +2.  Continue Pitocin. Preeclampsia:  no signs or symptoms of toxicity Fetal Wellbeing:  Category I Pain Control:  Labor support without medications I/D:  S/p AROM x 8 hours. Anticipated MOD:  NSVD  Catherine Garcia 08/11/2016, 8:31 AM

## 2016-08-11 NOTE — Progress Notes (Signed)
Catherine Garcia is a 41 y.o. G1P0 at 2671w3d admitted for active labor  Subjective: Patient is comfortable with her epidural   Objective: BP 113/75   Pulse 80   Temp 98.5 F (36.9 C) (Oral)   Resp 18   Ht 5\' 4"  (1.626 m)   Wt 88 kg (194 lb)   LMP 11/16/2015   SpO2 100%   BMI 33.30 kg/m  No intake/output data recorded. No intake/output data recorded.  FHT:  FHR: 120 bpm, variability: moderate,  accelerations:  Present,  decelerations:  Absent UC:   regular, every 2-4 minutes SVE: 5.5/90/-1 AROM clear fluid   Labs: Lab Results  Component Value Date   WBC 19.1 (H) 08/10/2016   HGB 15.7 (H) 08/10/2016   HCT 44.0 08/10/2016   MCV 88.0 08/10/2016   PLT 262 08/10/2016    Assessment / Plan: 38 wks and 3 days admitted in active labor  AROM performed. Recheck in 2 hours if no cervical change will start pitocin.  Continue penicillin for GBS prophylaxis  Williemae Muriel J. 08/11/2016, 12:28 AM

## 2016-08-11 NOTE — Lactation Note (Signed)
This note was copied from a baby's chart. Lactation Consultation Note  Patient Name: Catherine Edmon CrapeStacie Garcia ZOXWR'UToday's Date: 08/11/2016 Reason for consult: Initial assessment Baby at 4 hr of life. Mom had GDM and baby's blood sugar at 1612 was 36. Parents report baby has gone to breast "well" since the last blood draw and the lab will be back at 1900 to check it again. Suggested that they try to latch baby or spoon feed colostrum since it has been 2.5 hr since the baby ate. Parents were agreeable but declined help because they are waiting until the Grandparents and great grandparent leave to feed baby. Mom stated she was taught manual expression earlier today and lactation reviewed spoon feeding. Discussed baby behavior, feeding frequency, baby belly size, voids, wt loss, breast changes, and nipple care. Given lactation handouts. Aware of OP services and support group.     Maternal Data Formula Feeding for Exclusion: No Has patient been taught Hand Expression?: No Does the patient have breastfeeding experience prior to this delivery?: No  Feeding Feeding Type: Breast Fed Length of feed: 10 min  LATCH Score/Interventions Latch: Grasps breast easily, tongue down, lips flanged, rhythmical sucking.  Audible Swallowing: A few with stimulation  Type of Nipple: Everted at rest and after stimulation  Comfort (Breast/Nipple): Soft / non-tender     Hold (Positioning): No assistance needed to correctly position infant at breast.  LATCH Score: 9  Lactation Tools Discussed/Used WIC Program: No   Consult Status Consult Status: Follow-up Date: 08/12/16 Follow-up type: In-patient    Rulon Eisenmengerlizabeth E Manasa Spease 08/11/2016, 6:43 PM

## 2016-08-12 LAB — CBC
HCT: 37 % (ref 36.0–46.0)
Hemoglobin: 12.8 g/dL (ref 12.0–15.0)
MCH: 31.1 pg (ref 26.0–34.0)
MCHC: 34.6 g/dL (ref 30.0–36.0)
MCV: 90 fL (ref 78.0–100.0)
PLATELETS: 193 10*3/uL (ref 150–400)
RBC: 4.11 MIL/uL (ref 3.87–5.11)
RDW: 14.5 % (ref 11.5–15.5)
WBC: 17.1 10*3/uL — ABNORMAL HIGH (ref 4.0–10.5)

## 2016-08-12 NOTE — Progress Notes (Signed)
Postpartum day #1, VAVD due to maternal exhaustion  Subjective Pt without complaints.  Lochia normal, has continued to slow down.  Pain controlled.  Breast feeding yes.  Temp:  [97.4 F (36.3 C)-98.8 F (37.1 C)] 97.4 F (36.3 C) (01/31 0521) Pulse Rate:  [69-120] 69 (01/31 0521) Resp:  [18] 18 (01/31 0521) BP: (99-130)/(53-80) 99/67 (01/31 0521) SpO2:  [97 %-98 %] 98 % (01/31 0521)  Gen:  NAD, A&O x 3 Uterine fundus:  Firm, nontender Lochia normal Ext:  _Edema, no calf tenderness bilaterally  CBC    Component Value Date/Time   WBC 17.1 (H) 08/12/2016 0536   RBC 4.11 08/12/2016 0536   HGB 12.8 08/12/2016 0536   HGB 14.8 09/15/2011 1330   HCT 37.0 08/12/2016 0536   HCT 43.2 09/15/2011 1330   PLT 193 08/12/2016 0536   PLT 385 09/15/2011 1330   MCV 90.0 08/12/2016 0536   MCV 89.3 09/15/2011 1330   MCH 31.1 08/12/2016 0536   MCHC 34.6 08/12/2016 0536   RDW 14.5 08/12/2016 0536   RDW 12.6 09/15/2011 1330   LYMPHSABS 2.2 09/15/2011 1330   MONOABS 0.5 09/15/2011 1330   EOSABS 0.1 09/15/2011 1330   BASOSABS 0.1 09/15/2011 1330     A/P: S/p VAVD doing well. H/o Bilateral PEs on contraception.  On Lovenox 40 meq once daily resumed 12 hours after epidural catheter removed.  ~ 0330.  Monitor bleeding closely. Routine postpartum care. Lactation support. Discharge tomorrow. Circ today or tomorrow.  Previously consented.   [name] 08/12/2016, 8:01 AM

## 2016-08-12 NOTE — Lactation Note (Signed)
This note was copied from a baby's chart. Lactation Consultation Note  Patient Name: Catherine Garcia EAVWU'JToday's Date: 08/12/2016 Reason for consult: Follow-up assessment;Hyperbilirubinemia   Follow up with mom of 28 hour old infant. Infant with 13 BF for 10-30 minutes, 5 voids and 6 stools in 24 hours preceding this assessment. Infant being started on phototherapy. Infant with scalp bruising.   Mom was attempting to feed infant. Infant asleep at breast. Enc mom to use awakening techniques as needed to keep infant awake at breast. Hand expressed and obtained about 6 gtts colostrum that was fed to infant on spoon and gloved finger. He then awakened and latched to left breast in the cross cradle hold. He was more alert and active at the breast at this time.   Mom with large compressible breasts and everted nipples. A few gtts of colostrum obtained from each breast. Mom reports she is able to hand express.   DEBP set up with mom's permission. Parents were shown how to use on Initiate setting, assembling, disassembling and cleaning of pump parts. Enc mom to BF infant at breast STS 8-12 x in 24 hours, supplement with any EBM available via spoon or syringe, pump for 15 minutes with DEBP on Initiate setting, then hand express. BM storage reviewed with family. Showed parents how to spoon feed infant, infant tolerated spoon feeding well. Enc mom to call out for feeding assistance as needed.    Maternal Data Formula Feeding for Exclusion: No Has patient been taught Hand Expression?: Yes Does the patient have breastfeeding experience prior to this delivery?: No  Feeding Feeding Type: Breast Fed Length of feed: 15 min  LATCH Score/Interventions Latch: Repeated attempts needed to sustain latch, nipple held in mouth throughout feeding, stimulation needed to elicit sucking reflex. Intervention(s): Adjust position;Assist with latch;Breast massage;Breast compression  Audible Swallowing: A few with  stimulation Intervention(s): Alternate breast massage;Hand expression;Skin to skin  Type of Nipple: Everted at rest and after stimulation  Comfort (Breast/Nipple): Soft / non-tender     Hold (Positioning): Assistance needed to correctly position infant at breast and maintain latch. Intervention(s): Breastfeeding basics reviewed;Support Pillows;Position options;Skin to skin  LATCH Score: 7  Lactation Tools Discussed/Used WIC Program: No Pump Review: Setup, frequency, and cleaning;Milk Storage Initiated by:: [name], RN, IBCLC Date initiated:: 08/12/16   Consult Status Consult Status: Follow-up Date: 08/13/16 Follow-up type: In-patient    Silas FloodSharon S Imo [place]ie 08/12/2016, 7:16 PM

## 2016-08-12 NOTE — Anesthesia Postprocedure Evaluation (Signed)
Anesthesia Post Note  Patient: Catherine Garcia  Procedure(s) Performed: * No procedures listed *  Patient location during evaluation: Mother Baby Anesthesia Type: Epidural Level of consciousness: awake and alert Pain management: pain level controlled Vital Signs Assessment: post-procedure vital signs reviewed and stable Respiratory status: spontaneous breathing and nonlabored ventilation Cardiovascular status: stable Postop Assessment: no headache, no backache, epidural receding, patient able to bend at knees, no signs of nausea or vomiting and adequate PO intake Anesthetic complications: no        Last Vitals:  Vitals:   08/11/16 2100 08/12/16 0521  BP: 99/60 99/67  Pulse: 80 69  Resp: 18 18  Temp: 36.8 C 36.3 C    Last Pain:  Vitals:   08/12/16 0810  TempSrc:   PainSc: 0-No pain   Pain Goal: Patients Stated Pain Goal: 0 (08/10/16 1809)               Laban EmperorMalinova,Abena Erdman Hristova

## 2016-08-13 LAB — GLUCOSE, CAPILLARY: GLUCOSE-CAPILLARY: 129 mg/dL — AB (ref 65–99)

## 2016-08-13 MED ORDER — IBUPROFEN 600 MG PO TABS: 600.0000 mg | ORAL_TABLET | Freq: Four times a day (QID) | ORAL | 0 refills | Status: AC

## 2016-08-13 MED ORDER — ENOXAPARIN SODIUM 40 MG/0.4ML ~~LOC~~ SOLN: 40.0000 mg | SUBCUTANEOUS | 1 refills | Status: AC

## 2016-08-13 NOTE — Discharge Instructions (Signed)

## 2016-08-13 NOTE — Discharge Summary (Signed)
OB Discharge Summary     Patient Name: Catherine Garcia DOB: 1975/08/09 MRN: [id number]  Date of admission: 08/10/2016 Delivering MD: Geryl Rankins   Date of discharge: 08/13/2016  Admitting diagnosis: 38wks ctx 3 mins apart Intrauterine pregnancy: [redacted]w[redacted]d     Secondary diagnosis:  Active Problems:   Normal labor  Additional problems: H/o Pulmonary Emboli on contraception, GDM A2     Discharge diagnosis: Term Pregnancy Delivered and GDM A2                                                                                                Post partum procedures:None  Augmentation: AROM and Pitocin  Complications: None  Hospital course:  Onset of Labor With Vaginal Delivery     41 y.o. yo G1P1001 at [redacted]w[redacted]d was admitted in Active Labor on 08/10/2016. Patient had an uncomplicated labor course as follows:  Membrane Rupture Time/Date: 12:21 AM ,08/11/2016   Intrapartum Procedures: Episiotomy:                                           Lacerations:  2nd degree [3];Perineal [11]  Patient had a delivery of a Viable infant. 08/11/2016  Information for the patient's newborn:  Shuna, Tabor [[id number]]  Delivery Method: Vaginal, Vacuum (Extractor) (Filed from Delivery Summary)    Pateint had an uncomplicated postpartum course.  She is ambulating, tolerating a regular diet, passing flatus, and urinating well. Patient is discharged home in stable condition on 08/13/16.   Physical exam  Vitals:   08/11/16 2100 08/12/16 0521 08/12/16 1810 08/13/16 0605  BP: 99/60 99/67 106/72 102/78  Pulse: 80 69 74 79  Resp: 18 18 18 18   Temp: 98.2 F (36.8 C) 97.4 F (36.3 C) 97.7 F (36.5 C) 97.8 F (36.6 C)  TempSrc: Oral Oral Oral Oral  SpO2: 97% 98%    Weight:      Height:       General: alert, cooperative and no distress Lochia: appropriate Uterine Fundus: firm Incision: N/A DVT Evaluation: No evidence of DVT seen on physical exam. Calf/Ankle edema is present Labs: Lab Results  Component  Value Date   WBC 17.1 (H) 08/12/2016   HGB 12.8 08/12/2016   HCT 37.0 08/12/2016   MCV 90.0 08/12/2016   PLT 193 08/12/2016   CMP Latest Ref Rng & Units 08/10/2016  Glucose 65 - 99 mg/dL 782(N)  BUN 6 - 23 mg/dL -  Creatinine 5.62 - 1.30 mg/dL -  Sodium 865 - 784 mEq/L -  Potassium 3.5 - 5.3 mEq/L -  Chloride 96 - 112 mEq/L -  CO2 19 - 32 mEq/L -  Calcium 8.4 - 10.5 mg/dL -  Total Protein 6.0 - 8.3 g/dL -  Total Bilirubin 0.3 - 1.2 mg/dL -  Alkaline Phos 39 - 696 U/L -  AST 0 - 37 U/L -  ALT 0 - 35 U/L -    Discharge instruction: per After Visit Summary and "Baby and Me Booklet".  After  visit meds:   famotidine (PEPCID) 20 MG tablet Take 20 mg by mouth at bedtime.  Geryl RankinsEvelyn Garrit Marrow, MD Resume at Discharge (Patient Reported)   Prenatal Vit-Fe Fumarate-FA (PRENATAL MULTIVITAMIN) TABS tablet Take 1 tablet by mouth daily at 12 noon. Geryl RankinsEvelyn Trelyn Vanderlinde, MD Resume at Discharge (Patient Reported)   heparin [id number] UNIT/ML injection Inject 5,000 Units into the skin 2 (two) times daily. Inject 0.6825mL twice daily. Geryl RankinsEvelyn Maciel Kegg, MD Discontinued   enoxaparin (LOVENOX) 40 MG/0.4ML injection Inject 0.4 mLs (40 mg total) into the skin daily. Geryl RankinsEvelyn Toshia Larkin, MD Prescribed [Print]  ibuprofen (ADVIL,MOTRIN) 600 MG tablet Take 1 tablet (600 mg total) by mouth every 6 (six) hours. Geryl RankinsEvelyn Kaden Dunkel, MD Prescribed [Print]  Diet - low sodium heart healthy Routine, Clinic Performed Geryl RankinsEvelyn Jonetta Dagley, MD New at Discharge [Print]  Call MD for: temperature >100.4 Routine, Clinic Performed Geryl RankinsEvelyn Rosina Cressler, MD New at Discharge [Print]  Call MD for: severe uncontrolled pain Routine, Clinic Performed Geryl RankinsEvelyn Olof Marcil, MD New at Discharge [Print]  Call MD for: difficulty breathing, headache or visual disturbances Routine, Clinic Performed Geryl RankinsEvelyn Vedansh Kerstetter, MD New at Discharge [Print]  Call MD for: persistant dizziness or light-headedness Routine, Clinic Performed Geryl RankinsEvelyn Latalia Etzler, MD New at Discharge [Print]  Call MD for:  extreme fatigue Routine, Clinic Performed Geryl RankinsEvelyn Viviano Bir, MD New at Discharge [Print]  Sexual acrtivity Routine, Clinic Performed Geryl RankinsEvelyn Giovannina Mun, MD New at Discharge [Print]  No wound care Routine, Clinic Performed Geryl RankinsEvelyn Brogan England, MD New at Discharge      Diet: routine diet  Activity: Advance as tolerated. Pelvic rest for 6 weeks.   Outpatient follow up:6 weeks Follow up Appt:No future appointments. Follow up Visit:No Follow-up on file.  Postpartum contraception: IUD Paragard  Newborn Data: Live born female  Birth Weight: 7 lb 0.7 oz (3195 g) APGAR: 7, 8  Baby Feeding: Breast Disposition:Rooming In Circ done on 08/13/2016   08/13/2016 Geryl RankinsVARNADO, Ishani Goldwasser, MD

## 2016-08-13 NOTE — Lactation Note (Signed)
This note was copied from a baby's chart. Lactation Consultation Note  Patient Name: Catherine Garcia ZOXWR'UToday's Date: 08/13/2016 Reason for consult: Follow-up assessment;NICU baby;Infant < 6lbs   Follow up with parents. Infant transferred to NICU for hyperbilirubanemia. Mom reports she has pumped some and is getting a few gtts.  Providing Milk for Your Infant in NICU booklet given, discussed pumping schedule and breast milk storage for NICU infant. Enc parents to ask NICU for breast milk labels. Mom without questions/concerns at this time. Enc mom to pump 8-12 x in 24 hours for 15 minutes on Initiate setting with 4-5 hour to rest at night. Mom report she is able to go up and breast feed infant, enc mom to BF as much as possible and to call for assistance as needed. Enc mom to pump after visiting infant also. Enc mom to call for assistance as needed.    Maternal Data Formula Feeding for Exclusion: No Has patient been taught Hand Expression?: Yes Does the patient have breastfeeding experience prior to this delivery?: No  Feeding    LATCH Score/Interventions                      Lactation Tools Discussed/Used Pump Review: Setup, frequency, and cleaning;Milk Storage (For NICU infant) Initiated by:: Reviewed Date initiated:: 08/13/16   Consult Status Consult Status: Follow-up Date: 08/14/16 Follow-up type: In-patient    [name] 08/13/2016, 3:25 PM

## 2016-08-14 ENCOUNTER — Ambulatory Visit: Payer: Self-pay

## 2016-08-14 NOTE — Lactation Note (Signed)
This note was copied from a baby's chart. Lactation Consultation Note  Patient Name: Catherine Garcia [id number]'F Date: 08/14/2016   NICU baby 64 hours old. Called to NICU because mom did not take her pump kit tubing with her at D/C. Mom given an additional pump kit. Mom reports that she is getting about 10 ml of EBM when she pumps. Showed mom the location of bottles in the pumping room. Enc mom to hand express after pumping.   Maternal Data    Feeding    LATCH Score/Interventions                      Lactation Tools Discussed/Used     Consult Status      Andres Labrum 08/14/2016, 2:03 PM

## 2016-08-16 ENCOUNTER — Inpatient Hospital Stay (HOSPITAL_COMMUNITY): Payer: 59

## 2016-08-17 ENCOUNTER — Inpatient Hospital Stay (HOSPITAL_COMMUNITY): Admission: RE | Admit: 2016-08-17 | Payer: 59 | Source: Ambulatory Visit

## 2017-12-29 ENCOUNTER — Other Ambulatory Visit (HOSPITAL_COMMUNITY)
Admission: RE | Admit: 2017-12-29 | Discharge: 2017-12-29 | Disposition: A | Discharge status: Home / Self Care | Payer: 59 | Source: Ambulatory Visit | Attending: Obstetrics and Gynecology | Admitting: Obstetrics and Gynecology

## 2017-12-29 ENCOUNTER — Other Ambulatory Visit: Payer: Self-pay | Admitting: Obstetrics and Gynecology

## 2017-12-29 DIAGNOSIS — Z01411 Encounter for gynecological examination (general) (routine) with abnormal findings: Secondary | ICD-10-CM | POA: Insufficient documentation

## 2017-12-29 DIAGNOSIS — Z1231 Encounter for screening mammogram for malignant neoplasm of breast: Secondary | ICD-10-CM

## 2017-12-29 DIAGNOSIS — Z8632 Personal history of gestational diabetes: Secondary | ICD-10-CM | POA: Diagnosis not present

## 2017-12-29 DIAGNOSIS — Z01419 Encounter for gynecological examination (general) (routine) without abnormal findings: Secondary | ICD-10-CM | POA: Diagnosis not present

## 2017-12-31 LAB — CYTOLOGY - PAP
Diagnosis: NEGATIVE
HPV: NOT DETECTED

## 2018-01-04 DIAGNOSIS — Z13220 Encounter for screening for lipoid disorders: Secondary | ICD-10-CM | POA: Diagnosis not present

## 2018-04-14 DIAGNOSIS — Z23 Encounter for immunization: Secondary | ICD-10-CM | POA: Diagnosis not present

## 2018-07-01 ENCOUNTER — Ambulatory Visit
Admission: RE | Admit: 2018-07-01 | Discharge: 2018-07-01 | Disposition: A | Discharge status: Home / Self Care | Payer: 59 | Source: Ambulatory Visit | Attending: Obstetrics and Gynecology | Admitting: Obstetrics and Gynecology

## 2018-07-01 DIAGNOSIS — Z1231 Encounter for screening mammogram for malignant neoplasm of breast: Secondary | ICD-10-CM | POA: Diagnosis not present

## 2018-07-26 DIAGNOSIS — Z0000 Encounter for general adult medical examination without abnormal findings: Secondary | ICD-10-CM | POA: Diagnosis not present

## 2018-09-26 DIAGNOSIS — Z30431 Encounter for routine checking of intrauterine contraceptive device: Secondary | ICD-10-CM | POA: Diagnosis not present

## 2019-12-04 IMAGING — MG DIGITAL SCREENING BILATERAL MAMMOGRAM WITH TOMO AND CAD
8 series · 8 of 24 positions shown · non-contrast
Comparison: None.

CLINICAL DATA: Screening.

EXAM:
DIGITAL SCREENING BILATERAL MAMMOGRAM WITH TOMO AND CAD

[R MLO synth-2D]
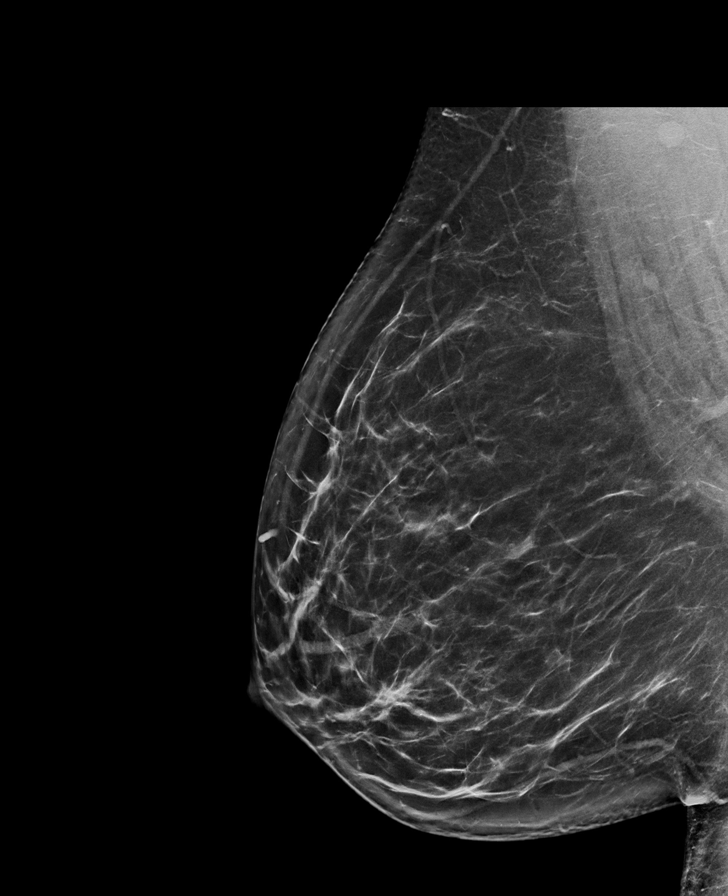

[L CC synth-2D]
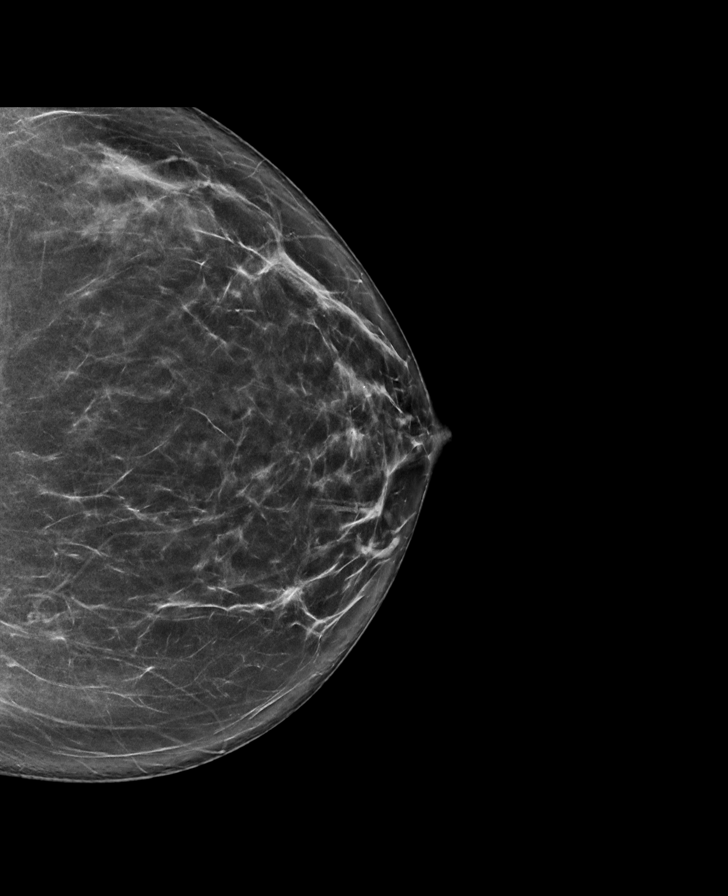

[R CC synth-2D]
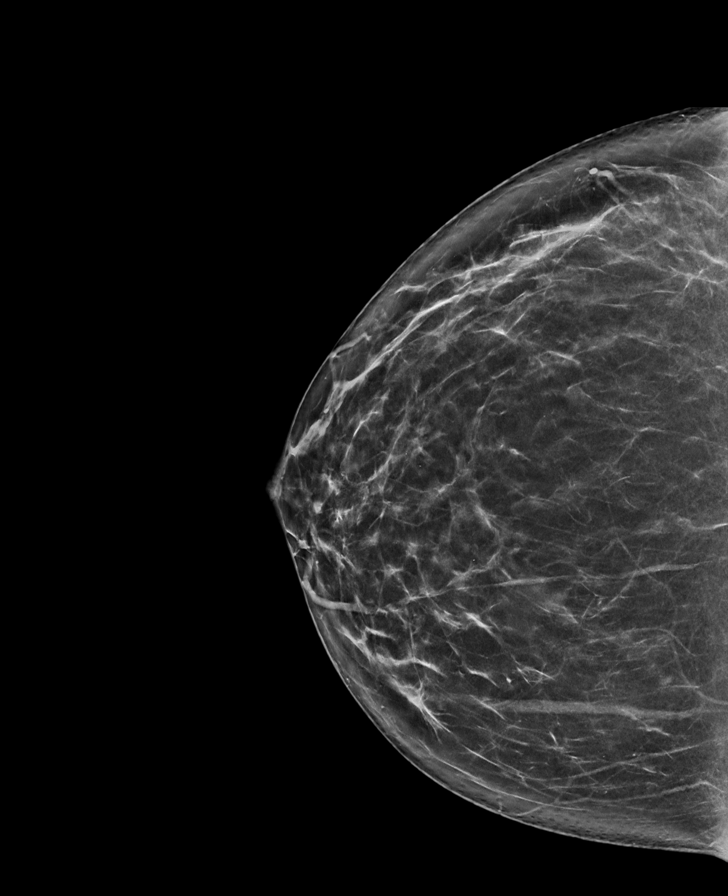

[L MLO synth-2D]
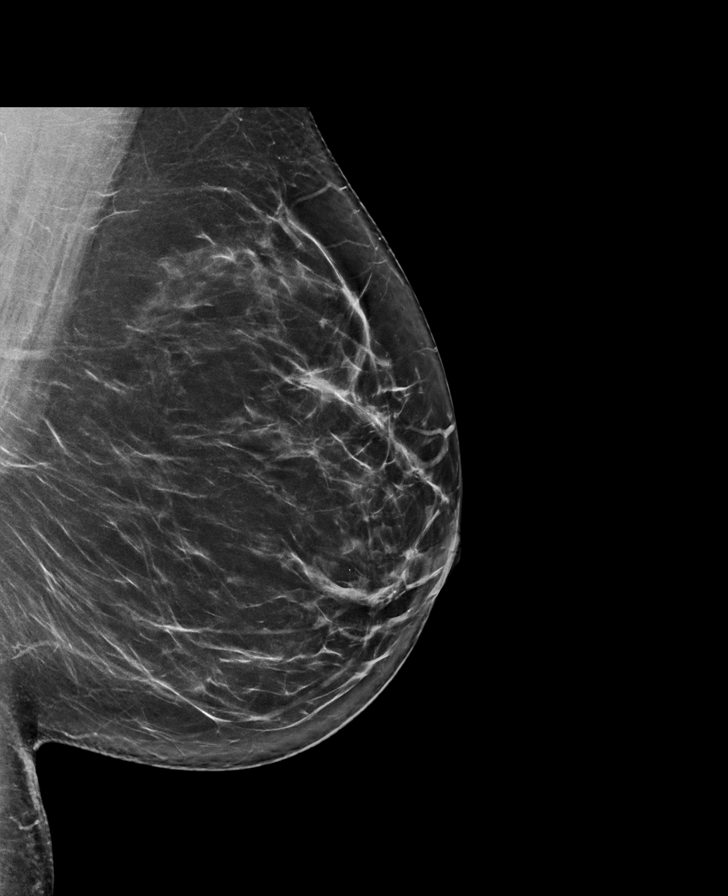

[R CC tomo · tomo slice 39/77.0]
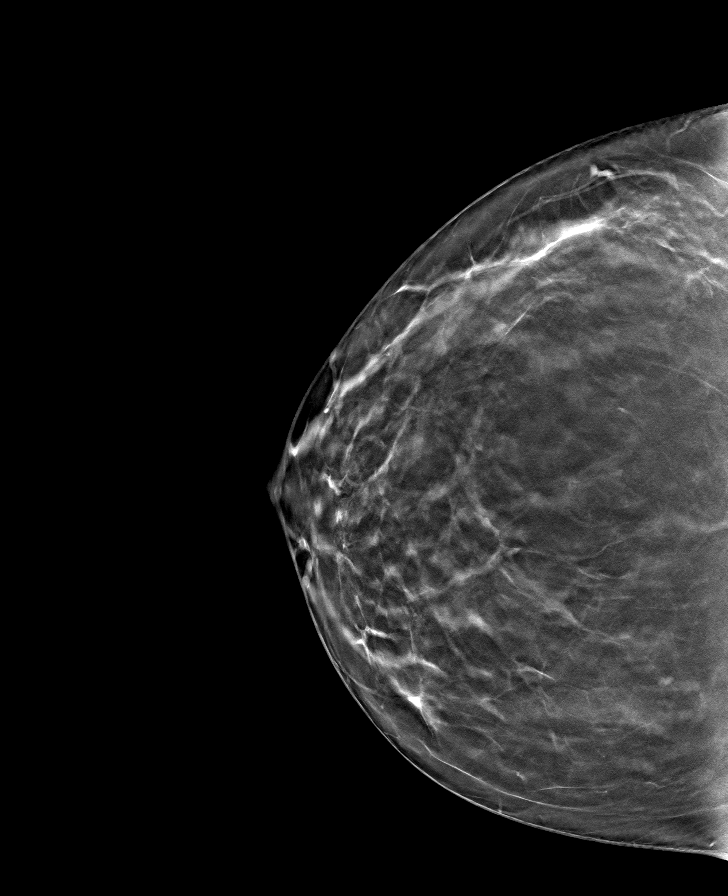

[R MLO tomo · tomo slice 45/90.0]
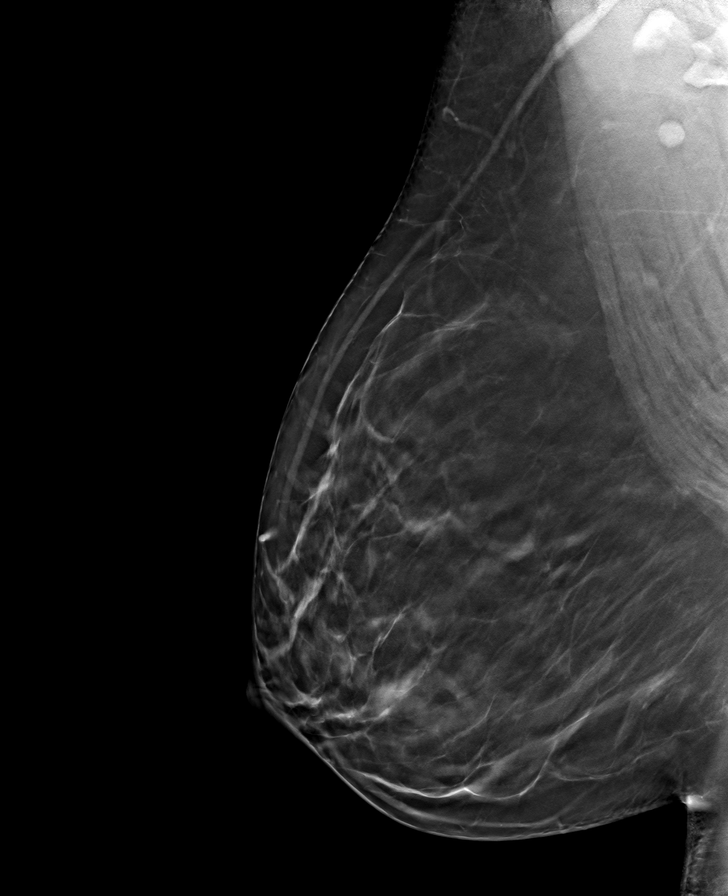

[L CC tomo · tomo slice 40/79.0]
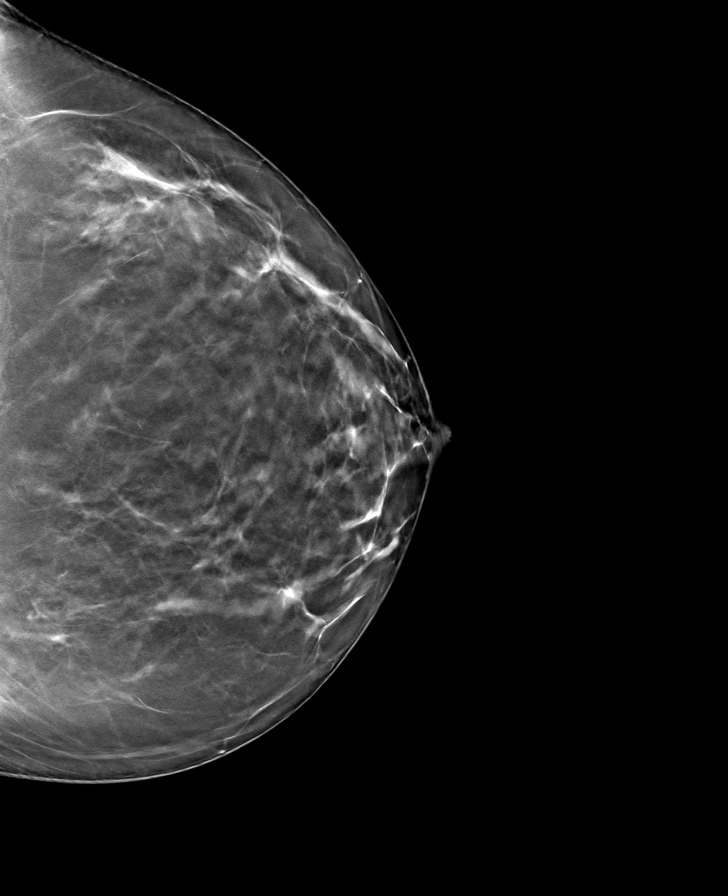

[L MLO tomo · tomo slice 45/88.0]
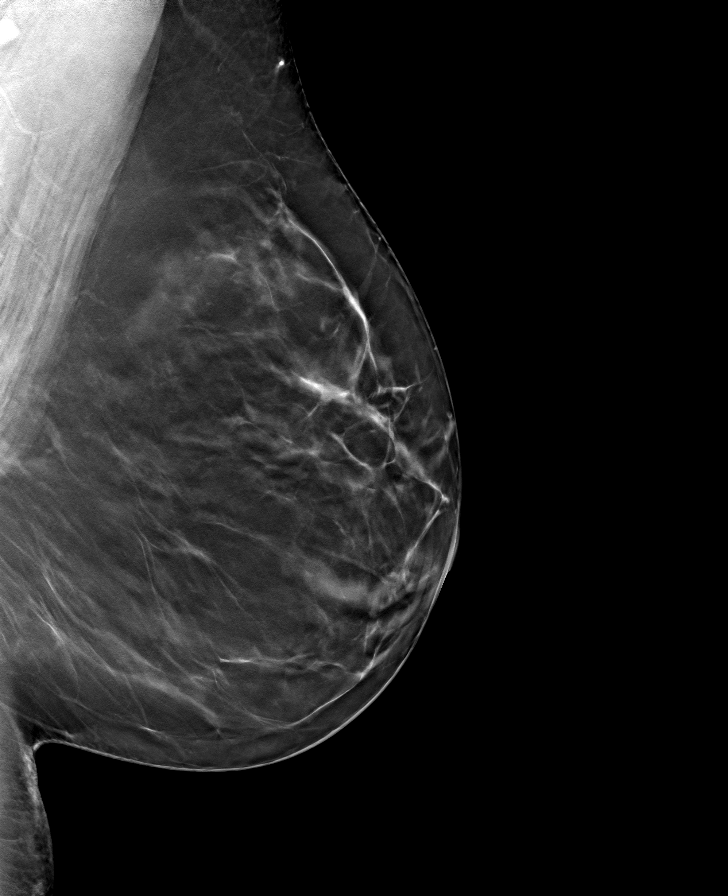

[8 of 24 positions shown; findings below may reference images not displayed]

ACR Breast Density Category b: There are scattered areas of
fibroglandular density.
FINDINGS: There are no findings suspicious for malignancy. Images were
processed with CAD.
IMPRESSION: No mammographic evidence of malignancy. A result letter of this
screening mammogram will be mailed directly to the patient.

RECOMMENDATION:
Screening mammogram in one year. (Code:Y5-G-EJ6)

BI-RADS CATEGORY  1: Negative.

## 2020-03-22 ENCOUNTER — Other Ambulatory Visit: Payer: Self-pay | Admitting: Obstetrics and Gynecology

## 2020-03-22 DIAGNOSIS — Z1231 Encounter for screening mammogram for malignant neoplasm of breast: Secondary | ICD-10-CM

## 2020-04-02 ENCOUNTER — Other Ambulatory Visit: Payer: Self-pay

## 2020-04-02 ENCOUNTER — Ambulatory Visit
Admission: RE | Admit: 2020-04-02 | Discharge: 2020-04-02 | Disposition: A | Discharge status: Home / Self Care | Payer: 59 | Source: Ambulatory Visit

## 2020-04-02 DIAGNOSIS — Z1231 Encounter for screening mammogram for malignant neoplasm of breast: Secondary | ICD-10-CM

## 2020-04-04 ENCOUNTER — Other Ambulatory Visit: Payer: Self-pay | Admitting: Obstetrics and Gynecology

## 2020-04-04 DIAGNOSIS — R928 Other abnormal and inconclusive findings on diagnostic imaging of breast: Secondary | ICD-10-CM

## 2020-04-12 ENCOUNTER — Ambulatory Visit
Admission: RE | Admit: 2020-04-12 | Discharge: 2020-04-12 | Disposition: A | Discharge status: Home / Self Care | Payer: 59 | Source: Ambulatory Visit | Attending: Obstetrics and Gynecology | Admitting: Obstetrics and Gynecology

## 2020-04-12 ENCOUNTER — Other Ambulatory Visit: Payer: Self-pay

## 2020-04-12 ENCOUNTER — Ambulatory Visit: Payer: 59

## 2020-04-12 DIAGNOSIS — R928 Other abnormal and inconclusive findings on diagnostic imaging of breast: Secondary | ICD-10-CM

## 2021-05-14 ENCOUNTER — Other Ambulatory Visit: Payer: Self-pay | Admitting: Obstetrics and Gynecology

## 2021-05-14 DIAGNOSIS — Z1231 Encounter for screening mammogram for malignant neoplasm of breast: Secondary | ICD-10-CM

## 2021-06-18 ENCOUNTER — Ambulatory Visit
Admission: RE | Admit: 2021-06-18 | Discharge: 2021-06-18 | Disposition: A | Discharge status: Home / Self Care | Payer: Managed Care, Other (non HMO) | Source: Ambulatory Visit

## 2021-06-18 DIAGNOSIS — Z1231 Encounter for screening mammogram for malignant neoplasm of breast: Secondary | ICD-10-CM

## 2022-05-06 ENCOUNTER — Other Ambulatory Visit: Payer: Self-pay | Admitting: Obstetrics and Gynecology

## 2022-05-06 DIAGNOSIS — Z1231 Encounter for screening mammogram for malignant neoplasm of breast: Secondary | ICD-10-CM

## 2022-06-29 ENCOUNTER — Ambulatory Visit
Admission: RE | Admit: 2022-06-29 | Discharge: 2022-06-29 | Disposition: A | Discharge status: Home / Self Care | Payer: Managed Care, Other (non HMO) | Source: Ambulatory Visit

## 2022-06-29 DIAGNOSIS — Z1231 Encounter for screening mammogram for malignant neoplasm of breast: Secondary | ICD-10-CM

## 2023-02-10 DIAGNOSIS — Z79899 Other long term (current) drug therapy: Secondary | ICD-10-CM | POA: Diagnosis not present

## 2023-02-10 DIAGNOSIS — Z8249 Family history of ischemic heart disease and other diseases of the circulatory system: Secondary | ICD-10-CM | POA: Diagnosis not present

## 2023-02-10 DIAGNOSIS — R7303 Prediabetes: Secondary | ICD-10-CM | POA: Diagnosis not present

## 2023-02-10 DIAGNOSIS — Z6835 Body mass index (BMI) 35.0-35.9, adult: Secondary | ICD-10-CM | POA: Diagnosis not present

## 2023-02-10 DIAGNOSIS — E782 Mixed hyperlipidemia: Secondary | ICD-10-CM | POA: Diagnosis not present

## 2023-02-10 DIAGNOSIS — Z0000 Encounter for general adult medical examination without abnormal findings: Secondary | ICD-10-CM | POA: Diagnosis not present

## 2023-02-10 DIAGNOSIS — K219 Gastro-esophageal reflux disease without esophagitis: Secondary | ICD-10-CM | POA: Diagnosis not present

## 2023-04-16 DIAGNOSIS — Z1211 Encounter for screening for malignant neoplasm of colon: Secondary | ICD-10-CM | POA: Diagnosis not present

## 2023-05-12 DIAGNOSIS — K219 Gastro-esophageal reflux disease without esophagitis: Secondary | ICD-10-CM | POA: Diagnosis not present

## 2023-05-12 DIAGNOSIS — R7303 Prediabetes: Secondary | ICD-10-CM | POA: Diagnosis not present

## 2023-05-12 DIAGNOSIS — E6609 Other obesity due to excess calories: Secondary | ICD-10-CM | POA: Diagnosis not present

## 2023-05-13 DIAGNOSIS — Z124 Encounter for screening for malignant neoplasm of cervix: Secondary | ICD-10-CM | POA: Diagnosis not present

## 2023-05-13 DIAGNOSIS — Z01419 Encounter for gynecological examination (general) (routine) without abnormal findings: Secondary | ICD-10-CM | POA: Diagnosis not present

## 2023-06-18 ENCOUNTER — Other Ambulatory Visit: Payer: Self-pay | Admitting: Obstetrics and Gynecology

## 2023-06-18 DIAGNOSIS — H53143 Visual discomfort, bilateral: Secondary | ICD-10-CM | POA: Diagnosis not present

## 2023-06-18 DIAGNOSIS — Z1231 Encounter for screening mammogram for malignant neoplasm of breast: Secondary | ICD-10-CM

## 2023-06-18 DIAGNOSIS — E119 Type 2 diabetes mellitus without complications: Secondary | ICD-10-CM | POA: Diagnosis not present

## 2023-07-26 ENCOUNTER — Ambulatory Visit
Admission: RE | Admit: 2023-07-26 | Discharge: 2023-07-26 | Disposition: A | Discharge status: Home / Self Care | Payer: BC Managed Care – PPO | Source: Ambulatory Visit

## 2023-07-26 DIAGNOSIS — Z1231 Encounter for screening mammogram for malignant neoplasm of breast: Secondary | ICD-10-CM

## 2023-07-28 DIAGNOSIS — R7303 Prediabetes: Secondary | ICD-10-CM | POA: Diagnosis not present

## 2023-07-28 DIAGNOSIS — Z79899 Other long term (current) drug therapy: Secondary | ICD-10-CM | POA: Diagnosis not present

## 2024-02-23 DIAGNOSIS — D649 Anemia, unspecified: Secondary | ICD-10-CM | POA: Diagnosis not present

## 2024-02-23 DIAGNOSIS — R7303 Prediabetes: Secondary | ICD-10-CM | POA: Diagnosis not present

## 2024-02-23 DIAGNOSIS — E782 Mixed hyperlipidemia: Secondary | ICD-10-CM | POA: Diagnosis not present

## 2024-02-23 DIAGNOSIS — K219 Gastro-esophageal reflux disease without esophagitis: Secondary | ICD-10-CM | POA: Diagnosis not present

## 2024-02-23 DIAGNOSIS — Z0000 Encounter for general adult medical examination without abnormal findings: Secondary | ICD-10-CM | POA: Diagnosis not present

## 2024-03-23 DIAGNOSIS — K648 Other hemorrhoids: Secondary | ICD-10-CM | POA: Diagnosis not present

## 2024-03-23 DIAGNOSIS — Z1211 Encounter for screening for malignant neoplasm of colon: Secondary | ICD-10-CM | POA: Diagnosis not present

## 2024-04-18 DIAGNOSIS — D649 Anemia, unspecified: Secondary | ICD-10-CM | POA: Diagnosis not present
